# Patient Record
Sex: Male | Born: 1965
Health system: Southern US, Community
[De-identification: ages and names within clinical notes are randomized; demographics above are authoritative.]

## PROBLEM LIST (undated history)

## (undated) DIAGNOSIS — I10 Essential (primary) hypertension: Secondary | ICD-10-CM

## (undated) DIAGNOSIS — G709 Myoneural disorder, unspecified: Secondary | ICD-10-CM

## (undated) DIAGNOSIS — K219 Gastro-esophageal reflux disease without esophagitis: Secondary | ICD-10-CM

## (undated) DIAGNOSIS — E785 Hyperlipidemia, unspecified: Secondary | ICD-10-CM

## (undated) DIAGNOSIS — M47816 Spondylosis without myelopathy or radiculopathy, lumbar region: Secondary | ICD-10-CM

## (undated) DIAGNOSIS — F411 Generalized anxiety disorder: Secondary | ICD-10-CM

## (undated) DIAGNOSIS — J189 Pneumonia, unspecified organism: Secondary | ICD-10-CM

## (undated) DIAGNOSIS — Z720 Tobacco use: Secondary | ICD-10-CM

## (undated) HISTORY — DX: Essential (primary) hypertension: I10

## (undated) HISTORY — DX: Gastro-esophageal reflux disease without esophagitis: K21.9

## (undated) HISTORY — DX: Pneumonia, unspecified organism: J18.9

## (undated) HISTORY — DX: Generalized anxiety disorder: F41.1

## (undated) HISTORY — DX: Spondylosis without myelopathy or radiculopathy, lumbar region: M47.816

## (undated) HISTORY — DX: Myoneural disorder, unspecified: G70.9

## (undated) HISTORY — PX: BACK SURGERY: SHX140

## (undated) HISTORY — DX: Tobacco use: Z72.0

## (undated) HISTORY — PX: CARPAL TUNNEL RELEASE: SHX101

## (undated) HISTORY — PX: SPINE SURGERY: SHX786

## (undated) HISTORY — DX: Hyperlipidemia, unspecified: E78.5

---

## 2001-01-05 ENCOUNTER — Encounter: Payer: Self-pay | Admitting: Family Medicine

## 2001-01-05 ENCOUNTER — Ambulatory Visit (HOSPITAL_COMMUNITY): Admission: RE | Admit: 2001-01-05 | Discharge: 2001-01-05 | Payer: Self-pay | Admitting: Family Medicine

## 2001-03-18 ENCOUNTER — Inpatient Hospital Stay (HOSPITAL_COMMUNITY): Admission: RE | Admit: 2001-03-18 | Discharge: 2001-03-19 | Payer: Self-pay | Admitting: Neurosurgery

## 2001-03-18 ENCOUNTER — Encounter: Payer: Self-pay | Admitting: Neurosurgery

## 2001-04-01 HISTORY — PX: AMPUTATION: SHX166

## 2002-12-31 ENCOUNTER — Emergency Department (HOSPITAL_COMMUNITY): Admission: EM | Admit: 2002-12-31 | Discharge: 2002-12-31 | Payer: Self-pay | Admitting: Emergency Medicine

## 2002-12-31 ENCOUNTER — Encounter: Payer: Self-pay | Admitting: Emergency Medicine

## 2003-04-11 ENCOUNTER — Ambulatory Visit (HOSPITAL_COMMUNITY): Admission: RE | Admit: 2003-04-11 | Discharge: 2003-04-11 | Payer: Self-pay | Admitting: Family Medicine

## 2003-09-02 ENCOUNTER — Encounter (INDEPENDENT_AMBULATORY_CARE_PROVIDER_SITE_OTHER): Payer: Self-pay | Admitting: *Deleted

## 2003-09-02 ENCOUNTER — Ambulatory Visit (HOSPITAL_BASED_OUTPATIENT_CLINIC_OR_DEPARTMENT_OTHER): Admission: RE | Admit: 2003-09-02 | Discharge: 2003-09-02 | Payer: Self-pay | Admitting: Orthopedic Surgery

## 2004-03-30 ENCOUNTER — Ambulatory Visit (HOSPITAL_COMMUNITY): Admission: RE | Admit: 2004-03-30 | Discharge: 2004-03-30 | Payer: Self-pay | Admitting: Family Medicine

## 2004-05-25 ENCOUNTER — Ambulatory Visit (HOSPITAL_COMMUNITY): Admission: RE | Admit: 2004-05-25 | Discharge: 2004-05-25 | Payer: Self-pay | Admitting: Family Medicine

## 2005-06-05 ENCOUNTER — Ambulatory Visit (HOSPITAL_COMMUNITY): Admission: RE | Admit: 2005-06-05 | Discharge: 2005-06-05 | Payer: Self-pay | Admitting: Family Medicine

## 2006-06-24 ENCOUNTER — Ambulatory Visit (HOSPITAL_COMMUNITY): Admission: RE | Admit: 2006-06-24 | Discharge: 2006-06-24 | Payer: Self-pay | Admitting: Family Medicine

## 2008-09-09 ENCOUNTER — Ambulatory Visit (HOSPITAL_COMMUNITY): Admission: RE | Admit: 2008-09-09 | Discharge: 2008-09-09 | Payer: Self-pay | Admitting: Family Medicine

## 2008-09-19 ENCOUNTER — Emergency Department (HOSPITAL_COMMUNITY): Admission: EM | Admit: 2008-09-19 | Discharge: 2008-09-19 | Payer: Self-pay | Admitting: Emergency Medicine

## 2008-09-30 ENCOUNTER — Emergency Department (HOSPITAL_COMMUNITY): Admission: EM | Admit: 2008-09-30 | Discharge: 2008-09-30 | Payer: Self-pay | Admitting: Emergency Medicine

## 2008-10-06 ENCOUNTER — Ambulatory Visit (HOSPITAL_COMMUNITY): Admission: RE | Admit: 2008-10-06 | Discharge: 2008-10-07 | Payer: Self-pay | Admitting: Neurosurgery

## 2008-10-13 ENCOUNTER — Encounter (INDEPENDENT_AMBULATORY_CARE_PROVIDER_SITE_OTHER): Payer: Self-pay | Admitting: Neurosurgery

## 2008-10-13 ENCOUNTER — Ambulatory Visit: Payer: Self-pay | Admitting: Surgery

## 2008-10-13 ENCOUNTER — Ambulatory Visit: Admission: RE | Admit: 2008-10-13 | Discharge: 2008-10-13 | Payer: Self-pay | Admitting: Neurosurgery

## 2009-02-09 ENCOUNTER — Ambulatory Visit (HOSPITAL_COMMUNITY): Admission: RE | Admit: 2009-02-09 | Discharge: 2009-02-09 | Payer: Self-pay | Admitting: Family Medicine

## 2010-07-08 LAB — BASIC METABOLIC PANEL
BUN: 13 mg/dL (ref 6–23)
CO2: 29 mEq/L (ref 19–32)
Calcium: 9.6 mg/dL (ref 8.4–10.5)
Chloride: 99 mEq/L (ref 96–112)
Creatinine, Ser: 0.66 mg/dL (ref 0.4–1.5)
GFR calc Af Amer: >60 mL/min (ref >60)
GFR calc non Af Amer: >60 mL/min (ref >60)
Glucose, Bld: 113 mg/dL — ABNORMAL HIGH (ref 70–99)
Potassium: 4.5 mEq/L (ref 3.5–5.1)
Sodium: 139 mEq/L (ref 135–145)

## 2010-07-08 LAB — CBC
HCT: 40.7 % (ref 39.0–52.0)
Hemoglobin: 13.9 g/dL (ref 13.0–17.0)
MCHC: 34.1 g/dL (ref 30.0–36.0)
MCV: 90.2 fL (ref 78.0–100.0)
Platelets: 316 10*3/uL (ref 150–400)
RBC: 4.51 MIL/uL (ref 4.22–5.81)
RDW: 13.6 % (ref 11.5–15.5)
WBC: 11 10*3/uL — ABNORMAL HIGH (ref 4.0–10.5)

## 2010-08-14 NOTE — Op Note (Signed)
Terrence Robinson, Terrence Robinson               ACCOUNT NO.:  [id number]   MEDICAL RECORD NO.:  [id number]          PATIENT TYPE:  OIB   LOCATION:  3528                         FACILITY:  [facility]   PHYSICIAN:  Hewitt Shorts, M.D.DATE OF BIRTH:  [birth year]   DATE OF PROCEDURE:  10/06/2008  DATE OF DISCHARGE:                               OPERATIVE REPORT   PREOPERATIVE DIAGNOSES:  1. Left L4-5 lumbar disk herniation.  2. Lumbar stenosis.  3. Lumbar spondylosis.  4. Lumbar radiculopathy.   POSTOPERATIVE DIAGNOSES:  1. Left L4-5 lumbar disk herniation.  2. Lumbar stenosis.  3. Lumbar spondylosis.  4. Lumbar radiculopathy.   PROCEDURES:  Left L4-5 lumbar laminotomy and microdiskectomy with  microdissection.   SURGEON:  Hewitt Shorts, MD.   ASSISTANT:  Clydene Fake, MD.   ANESTHESIA:  General endotracheal.   INDICATIONS:  The patient is a 45 year old man who presented with left  lumbar radiculopathy.  MRI scan showed a fairly large sensitive left L4-  5 lumbar disk herniation.  He also had hypertrophic changes involving  the left L4-5 facet with bony and ligament overgrowth causing dorsal  lateral encroachment with a resulting marked lateral recess stenosis.  Decision was made to proceed with decompression.   PROCEDURE:  The patient was brought to the operating room and placed  under general endotracheal anesthesia.  The patient was turned to a  prone position.  Lumbar region was prepped with Betadine soap and  solution and draped in a sterile fashion.  The midline was infiltrated  with local anesthetic with epinephrine and x-ray was taken.  The L4-L5  level was identified and a incision was made over the L4-5 level and  carried down through the subcutaneous tissue.  Bipolar electrocautery  was used to maintain hemostasis.  Dissection was carried down to the  lumbar fascia, which was incised on the left side of the midline.  The  paraspinal muscles were dissected from the  spinous process and lamina in  a subperiosteal fashion.  The L4-L5 intralaminar space was identified.  Then, additional x-rays were taken reconfirmed the localization.  We  then draped the operating microscope and the remainder of the procedure  was performed using microdissection and microsurgical technique.  The  laminotomy was performed using the [employer] high-speed drill along with  Kerrison punches.  The ligamentum bony overgrowth were removed  decompressing the lateral aspect of the thecal sac and exiting left L5  nerve root.  We then gently retracted the thecal sac and nerve root  medially and large disk herniation exposed overlying epidural veins were  coagulated and divided.  We then incised the remaining portion of the  annulus, although there was fragment protruding through the annulus and  was decided to open it up further to more effectively remove the disk  herniation, and the disk herniation was removed in the piecemeal  fashion.  We then entered in to the disk space and continued to remove  degenerated disk material.  A thorough diskectomy was performed using a  variety of pituitary rongeurs with good decompression of the thecal sac  and nerve root and removal of all loose fragments of the disc material  from both the disc space and the epidural space.  Once the diskectomy  was completed, hemostasis was established with the use of bipolar  cautery, Gelfoam and thrombin and the edges of the bone were waxed as  needed.  Gelfoam was removed.  The wound was irrigated.  Hemostasis was  confirmed and then we proceeded with closure.  Prior to closure, we  instilled 2 mL of fentanyl and 80 mg of Depo-Medrol into the epidural  space.  The deep fascia was closed with interrupted undyed #1 Vicryl  sutures.  Scarpa fascia was closed with interrupted undyed #1 Vicryl  sutures.  The subcutaneous and subcuticular were closed with interrupted  inverted 2-0 undyed Vicryl sutures. The skin  was reapproximated with  Dermabond.  The procedure was tolerated well.  The estimated blood loss  was 50 mL.  Sponge and needle count were correct.  Following surgery,  the patient was returned back to the supine position, to be reversed  from the anesthetic, extubated, and transferred to the recovery room for  further care.      Hewitt Shorts, M.D.  Electronically Signed     RWN/MEDQ  D:  10/06/2008  T:  10/07/2008  Job:  [id number]

## 2010-08-17 NOTE — Op Note (Signed)
NAME:  Terrence Robinson, Terrence Robinson                         ACCOUNT NO.:  [id number]   MEDICAL RECORD NO.:  [id number]                   PATIENT TYPE:  AMB   LOCATION:  DSC                                  FACILITY:  [facility]   PHYSICIAN:  Katy Fitch. Naaman Plummer., M.D.          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   POSTOPERATIVE DIAGNOSIS:  Painful amputation stump, left index finger,  status post crushing injury October 2004, with clinical evidence of ulnar  proper digital nerve neuroma and probable radial proper digital nerve  neuroma and retained nail remnant.   OPERATION:  Distal interphalangeal disarticulation and revision amputation  of left index finger amputation stump with resection of nail remnants and  resection of ulnar proper digital nerve neuroma and radial proper digital  nerve neuroma.   OPERATING SURGEON:  Katy Fitch. Sypher, M.D.   ANESTHESIA:  General by LMA, supervising anesthesiologist is Janetta Hora.  Gelene Mink, M.D.   INDICATIONS:  Tarquin Welcher is Robinson 45 year old gentleman employed in  Set [employer].  In October 2004 he had Robinson crushing injury to his left index  finger.  He had Robinson revision amputation performed at Robinson proximal epiphyseal  level of the distal phalanx.   During his recovery from the injury he noted the development of Robinson nail horn  and Robinson painful mass on the ulnar aspect of his fingertip.   The mass was consistent with either an epidermal inclusion cyst or Robinson painful  neuroma.   He observed this for approximately months and presented for Robinson hand surgery  consultation.  He was Robinson former patient of the [employer] [facility] [place] and  was familiar with our practice.   Clinical examination suggested an ulnar proper digital nerve neuroma and Robinson  retained nail fragment.   X-rays revealed that his distal phalangeal fragment was less than 2 mm in  height and would not be functional.   We recommended revision amputation with DIP disarticulation and excision of  his neuromas and nail remnants.   After informed consent, he is brought to the operating room at this time.   Karmelo Alfonzo was brought to the operating room and placed in the supine  position upon the operating table.  Following the induction of general  anesthesia by LMA, the left arm was prepped with Betadine soap and solution  and sterilely draped.  Robinson pneumatic tourniquet was applied to the proximal  brachium.   Following exsanguination of the arm with an Esmarch bandage, the arterial  tourniquet was inflated to 230 mmHg.  The procedure commenced with an  ellipsoid skin incision incorporating the nail  remnant.  This was extended  along the radial and ulnar midlateral lines to the neck of the middle  phalanx.   Subcutaneous tissues were meticulously dissected with Robinson 64 Beaver blade,  removing the dorsal nail remnants.  The distal phalanx was skeletonized with  removal of all soft tissues deep to the wedge skin resection.  The radial  and ulnar proper digital nerve neuromas were dissected free with fine  scissors, and there was noted to be an approximately 6 mm neuroma involving  the ulnar proper digital nerve and Robinson 4 mm neuroma involving the radial  proper digital nerve.  These were adherent to the volar plate and skin.   The neuromas were dissected free and the radial and ulnar proper digital  nerves were placed under tension and transected with Robinson fresh 15 blade.  The  proximal neuroma stumps were allowed to retract into virgin fat.   The collateral ligaments of the DIP joint were resected, followed by removal  of the remaining fragment of the distal  phalanx.  The distal condyles of the  middle phalanx were reshaped into Robinson bullet shape by removal of the palmar  radial and ulnar condyles with Robinson rongeur.   The soft tissues were tailored to provide Robinson satisfactory appearance of the  fingertip, followed by closure of the wound with mattress suture of 5-0  nylon.   There were no apparent complications.   Mr. Lips tolerated his surgery and anesthesia well.  He was transferred  to the recovery room with stable vital signs.   For aftercare he is given prescriptions for Percocet 5 mg one or two tablets  p.o. q.4-6h. p.r.n. pain, Robinson total of 20 tablets without refill.  He is also  given Robinson prescription for Keflex one p.o. q.8h. x4 days as Robinson prophylactic  antibiotic.                                               Katy Fitch Naaman Plummer., M.D.    RVS/MEDQ  D:  09/02/2003  T:  09/02/2003  Job:  [id number]

## 2010-08-17 NOTE — Consult Note (Signed)
   NAME:  Terrence, BIEHN Robinson                         ACCOUNT NO.:  [id number]   MEDICAL RECORD NO.:  [id number]                   PATIENT TYPE:  EMS   LOCATION:  ED                                   FACILITY:  [facility]   PHYSICIAN:  Nadara Mustard, M.D.                DATE OF BIRTH:  [birth year]   DATE OF CONSULTATION:  12/31/2002  DATE OF DISCHARGE:                                   CONSULTATION   HISTORY OF PRESENT ILLNESS:  The patient is Robinson 45 year old gentleman who was  at work. 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 Blood pressure 153/92, pulse 94,  respiratory rate 20, O2 saturations 99% on room air.   PAST MEDICAL HISTORY:  Negative.   REVIEW OF SYMPTOMS:  Positive for anxiety.   IMMUNIZATIONS:  Tetanus unknown.   SOCIAL HISTORY:  He works as Robinson Location manager.   Checked on examination, the tuft of the left index finger is amputated. He  is left hand dominant. The complete nail matrix has been amputated as well.  Radiographs shows partial amputation of the distal tuft of the left index  finger.   ASSESSMENT:  Open amputation left index finger.   PLAN:  The patient underwent Robinson digital block with 10 mL of 1% lidocaine  plain. After an adequate level of anesthesia was obtained, the patient's  left upper extremity was prepped using Betadine paint and scrub and draped  into Robinson sterile field. The edge of the skin were trimmed. An advancement flap  was created both dorsally and palmar ly. The distal tuft of the index finger  was amputated with the rongeur. The wound was irrigated, cleansed,  hemostasis was obtained. The patient received Robinson DT shot and IV Kefzol prior  to surgery. After cleansing, the advancement flaps were advanced and the  wound was closed using 3-0 nylon. The wound was covered with Adaptic  orthopedic sponges, sterile dressing and Robinson  Coban dressing. The patient was  discharged to home. He was given Robinson prescription for Keflex and Vicodin. Plan  to followup in the office in three days.                                               Nadara Mustard, M.D.    MVD/MEDQ  D:  12/31/2002  T:  01/01/2003  Job:  [id number]

## 2010-08-17 NOTE — Op Note (Signed)
[facility]  Patient:    Terrence Robinson, Terrence Robinson Visit Number: [id number] MRN: [id number]          Service Type: SUR Location: [id number] Attending Physician:  Barton Fanny Dictated by:   Hewitt Shorts, M.D. Proc. Date: 03/18/01 Admit Date:  03/18/2001 Discharge Date: 03/19/2001                             Operative Report  PREOPERATIVE DIAGNOSIS:  Right L5-S1 lumbar disk herniation.  POSTOPERATIVE DIAGNOSIS:  Right L5-S1 lumbar disk herniation.  PROCEDURE:  Right L5-S1 lumbar laminotomy and microdiskectomy.  SURGEON:  Hewitt Shorts, M.D.  ASSISTANT:  Stefani Dama, M.D.  ANESTHESIA:  General endotracheal.  INDICATIONS:  The patient is a 45 year old man who presents with right lumbar radiculopathy.  The decision was made to proceed with elective laminotomy and microdiskectomy.  DESCRIPTION OF PROCEDURE:  The patient was brought to the operating room and placed under general endotracheal anesthesia.  The patient was turned to a prone position.  The lumbar region was prepped with Betadine soap and solution, and draped in a sterile fashion.  The midline was infiltrated with local anesthetic with epinephrine.  A localizing x-ray was taken and the L5-S1 level identified and a midline incision made over the L5-S1 level and carried down to the subcutaneous tissue.  Dissection was carried down to the lumbar fascia which was incised on the right side of the midline, and the paraspinal muscle with resection of the spinous process and lamina in a subperiosteal fashion.  A self-retaining retractor was placed and the L5-S1 interlaminar space identified.  An x-ray was taken to confirm the localization, and then a laminotomy was performed using the [facility] Max drill and Kerrison punches.  The microscope was draped and brought into the field to provide instant magnification, illumination, and visualization, and the remainder of the  procedure was performed using microdissection and microsurgical technique.  The ligamentum flavum was carefully protected, and the underlying thecal sac and nerve root were identified.  There was a large disk fragment protruding into the axilla of the S1 nerve root.  The overlying epidural veins were coagulated and incised, and the disk herniation incised, and disk material removed.  We eventually removed a sufficient amount of disk material so as to be able to retract the S1 nerve root along with the thecal sac.  There was significant _________ overgrowth of both bone and ligament tissue and this was removed as well.  In the end, all loose fragments of disk material were removed from both the epidural space and the disk space, and in the end, good decompression of the thecal sac and nerve root was achieved. The wound was irrigated with Bacitracin solution.  Check for hemostasis was established and confirmed, and then we instilled 2 cc of fentanyl and 80 mg of Depo-Medrol into the epidural space and proceeded with closure.  The deep fascia was closed with interrupted undyed 0 Vicryl sutures, and the subcutaneous and subcuticular were closed with interrupted and inverted 2-0 undyed Vicryl sutures, and the skin was reapproximated with Dermabond.  The patient tolerated the procedure well.  Estimated blood loss was between 25 cc and 50 cc.  Sponge and needle count correct.  Following surgery, the patient was turned back to the supine position to be reversed from the anesthetic, extubated, and transferred to the recovery room for further care. Dictated by:  Hewitt Shorts, M.D. Attending Physician:  Barton Fanny DD:  03/18/01 TD:  03/19/01 Job: [id number]

## 2010-10-16 ENCOUNTER — Other Ambulatory Visit (HOSPITAL_COMMUNITY): Payer: Self-pay | Admitting: Family Medicine

## 2010-10-16 DIAGNOSIS — M549 Dorsalgia, unspecified: Secondary | ICD-10-CM

## 2010-10-19 ENCOUNTER — Ambulatory Visit (HOSPITAL_COMMUNITY)
Admission: RE | Admit: 2010-10-19 | Discharge: 2010-10-19 | Disposition: A | Discharge status: Home / Self Care | Payer: BC Managed Care – PPO | Source: Ambulatory Visit | Attending: Family Medicine | Admitting: Family Medicine

## 2010-10-19 DIAGNOSIS — Z9889 Other specified postprocedural states: Secondary | ICD-10-CM | POA: Insufficient documentation

## 2010-10-19 DIAGNOSIS — M549 Dorsalgia, unspecified: Secondary | ICD-10-CM

## 2010-10-19 DIAGNOSIS — M5126 Other intervertebral disc displacement, lumbar region: Secondary | ICD-10-CM | POA: Insufficient documentation

## 2010-10-19 DIAGNOSIS — R209 Unspecified disturbances of skin sensation: Secondary | ICD-10-CM | POA: Insufficient documentation

## 2010-10-19 DIAGNOSIS — M545 Low back pain, unspecified: Secondary | ICD-10-CM | POA: Insufficient documentation

## 2010-10-25 ENCOUNTER — Encounter (HOSPITAL_COMMUNITY)
Admission: RE | Admit: 2010-10-25 | Discharge: 2010-10-25 | Disposition: A | Discharge status: Home / Self Care | Payer: BC Managed Care – PPO | Source: Ambulatory Visit | Attending: Neurosurgery | Admitting: Neurosurgery

## 2010-10-25 ENCOUNTER — Ambulatory Visit (HOSPITAL_COMMUNITY)
Admission: RE | Admit: 2010-10-25 | Discharge: 2010-10-25 | Disposition: A | Discharge status: Home / Self Care | Payer: BC Managed Care – PPO | Source: Ambulatory Visit | Attending: Neurosurgery | Admitting: Neurosurgery

## 2010-10-25 ENCOUNTER — Other Ambulatory Visit (HOSPITAL_COMMUNITY): Payer: Self-pay | Admitting: Neurosurgery

## 2010-10-25 DIAGNOSIS — F17200 Nicotine dependence, unspecified, uncomplicated: Secondary | ICD-10-CM | POA: Insufficient documentation

## 2010-10-25 DIAGNOSIS — M5126 Other intervertebral disc displacement, lumbar region: Secondary | ICD-10-CM

## 2010-10-25 DIAGNOSIS — I10 Essential (primary) hypertension: Secondary | ICD-10-CM | POA: Insufficient documentation

## 2010-10-25 LAB — BASIC METABOLIC PANEL
BUN: 15 mg/dL (ref 6–23)
CO2: 27 mEq/L (ref 19–32)
Calcium: 10.2 mg/dL (ref 8.4–10.5)
Chloride: 97 mEq/L (ref 96–112)
Creatinine, Ser: 0.8 mg/dL (ref 0.50–1.35)
GFR calc Af Amer: >60 mL/min (ref >60)
GFR calc non Af Amer: >60 mL/min (ref >60)
Glucose, Bld: 93 mg/dL (ref 70–99)
Potassium: 3.9 mEq/L (ref 3.5–5.1)
Sodium: 136 mEq/L (ref 135–145)

## 2010-10-25 LAB — CBC
HCT: 41.9 % (ref 39.0–52.0)
Hemoglobin: 14.9 g/dL (ref 13.0–17.0)
MCH: 31.2 pg (ref 26.0–34.0)
MCHC: 35.6 g/dL (ref 30.0–36.0)
MCV: 87.7 fL (ref 78.0–100.0)
Platelets: 346 10*3/uL (ref 150–400)
RBC: 4.78 MIL/uL (ref 4.22–5.81)
RDW: 14 % (ref 11.5–15.5)
WBC: 13.3 10*3/uL — ABNORMAL HIGH (ref 4.0–10.5)

## 2010-10-25 LAB — SURGICAL PCR SCREEN
MRSA, PCR: NEGATIVE
Staphylococcus aureus: NEGATIVE

## 2010-10-26 ENCOUNTER — Observation Stay (HOSPITAL_COMMUNITY)
Admission: RE | Admit: 2010-10-26 | Discharge: 2010-10-27 | Disposition: A | Discharge status: Home / Self Care | Payer: BC Managed Care – PPO | Source: Ambulatory Visit | Attending: Neurosurgery | Admitting: Neurosurgery

## 2010-10-26 ENCOUNTER — Observation Stay (HOSPITAL_COMMUNITY): Payer: BC Managed Care – PPO

## 2010-10-26 DIAGNOSIS — F17200 Nicotine dependence, unspecified, uncomplicated: Secondary | ICD-10-CM | POA: Insufficient documentation

## 2010-10-26 DIAGNOSIS — Z01810 Encounter for preprocedural cardiovascular examination: Secondary | ICD-10-CM | POA: Insufficient documentation

## 2010-10-26 DIAGNOSIS — Z01812 Encounter for preprocedural laboratory examination: Secondary | ICD-10-CM | POA: Insufficient documentation

## 2010-10-26 DIAGNOSIS — M5126 Other intervertebral disc displacement, lumbar region: Principal | ICD-10-CM | POA: Insufficient documentation

## 2010-10-26 DIAGNOSIS — I10 Essential (primary) hypertension: Secondary | ICD-10-CM | POA: Insufficient documentation

## 2010-10-26 DIAGNOSIS — M47817 Spondylosis without myelopathy or radiculopathy, lumbosacral region: Secondary | ICD-10-CM | POA: Insufficient documentation

## 2010-10-26 DIAGNOSIS — E669 Obesity, unspecified: Secondary | ICD-10-CM | POA: Insufficient documentation

## 2010-10-26 DIAGNOSIS — Z79899 Other long term (current) drug therapy: Secondary | ICD-10-CM | POA: Insufficient documentation

## 2010-11-05 NOTE — Op Note (Signed)
NAMEZAYON, TRULSON               ACCOUNT NO.:  [id number]  MEDICAL RECORD NO.:  [id number]  LOCATION:  3599                         FACILITY:  [facility]  PHYSICIAN:  Hewitt Shorts, M.D.DATE OF BIRTH:  Mar 01, 1966  DATE OF PROCEDURE:  10/26/2010 DATE OF DISCHARGE:                              OPERATIVE REPORT   PREOPERATIVE DIAGNOSIS:  Left L2-3 lumbar degenerative disease, lumbar spondylosis, and lumbar radiculopathy.  POSTOPERATIVE DIAGNOSIS:  Left L2-3 lumbar degenerative disease, lumbar spondylosis, and lumbar radiculopathy.  PROCEDURE:  Left L2-3 lumbar laminotomy and microdiskectomy.  SURGEON:  Hewitt Shorts, MD  ANESTHESIA:  General endotracheal.  INDICATIONS:  The patient is a 45 year old man who presented with an acute left lumbar radiculopathy.  MRI scan revealed a L2-3 disk herniation with fragment migrated caudally behind the body of L3. Decision made to proceed with elective laminotomy and microdiskectomy.  PROCEDURE:  The patient was brought to the operating room and placed on general endotracheal anesthesia.  The patient was turned to prone position, lumbar region was prepped with Betadine soap solution and draped in sterile fashion.  The midline cyst with local epinephrine and then an x-ray was taken.  The L2-3 level identified and a midline incision made over the L2-3 level and carried down through the subcutaneous tissue.  Bipolar cautery and electrocautery used to maintain hemostasis.  Dissection was carried down to the lumbar fascia which was incised on the left side of the midline.  The paraspinal muscles were dissected with spinous process and lamina in subperiosteal fashion.  Another x-ray was taken.  The L2-3 interlaminar space was identified and then the operating microscope was draped and brought to the field for additional navigation, illumination and visualization. The remainder of decompression performed using microdissection  with microsurgical technique.  A laminotomy was performed using the high- speed drill and Kerrison punches.  Ligamentum flavum was carefully removed and we identified the thecal sac and exiting left L3 nerve root. We gently retracted the nerve root medially and immediately encountered a free fragment disk herniation.  This was removed.  We then further examined the epidural space.  The L2-3 disk was degenerated.  There was a small rent in the annulus of the disk through which the fragment had herniated but there was no further fragments within the epidural space and good decompression of the L3 nerve root and thecal sac had been achieved.  It was felt that there had been no advantage entering the disk space and therefore the wound was irrigated with bacitracin solution.  Hemostasis was established and then we instilled 2 mL of fentanyl and 80 mg Depo- Medrol into the epidural space to proceed with closure.  Deep fascia was closed with undyed 1 Vicryl sutures.  Scarpa fascia was closed with undyed 1 and 2-0 undyed Vicryl sutures.  The subcutaneous and subcuticular were closed with inverted 2-0 and 3-0 Vicryl sutures.  Skin was closed with Dermabond.  The procedure was tolerated well.  Estimated blood loss was 25 mL.  Sponge and needle count were correct.  Following surgery, the patient was to be turned back to supine position, reversed the anesthetic, extubated, transferred to the recovery room for further care.  Hewitt Shorts, M.D.     RWN/MEDQ  D:  10/26/2010  T:  10/26/2010  Job:  [id number]  Electronically Signed by Shirlean Kelly M.D. on 11/05/2010 10:55:51 AM

## 2010-12-20 NOTE — Discharge Summary (Signed)
  NAMELAKE, CINQUEMANI               ACCOUNT NO.:  [id number]  MEDICAL RECORD NO.:  [id number]  LOCATION:  3534                         FACILITY:  [facility]  PHYSICIAN:  Hewitt Shorts, M.D.DATE OF BIRTH:  03-Oct-1965  DATE OF ADMISSION:  10/26/2010 DATE OF DISCHARGE:  10/27/2010                              DISCHARGE SUMMARY   ADMISSION HISTORY AND PHYSICAL EXAMINATION:  The patient is a 45 year old man who presented with acute left lumbar radiculopathy, was found to be due to a left L2-3 lumbar disk herniation.  His symptoms had begun about 2-1/2 weeks earlier and he had no relief with medications and rest and he was evaluated with MRI scan which showed lumbar disk herniation and was admitted for surgical decompression.  Past history was notable for hypertension.  He does smoke tobacco and drink alcoholic beverages. General examination was intact.  Neurologic examination showed intact strength and sensation.  HOSPITAL COURSE:  The patient was admitted, underwent a left L2-3 lumbar laminotomy and microdiskectomy.  He did well following surgery.  He was up and ambulating and he was discharged by Dr. Coletta Memos who did not write a discharge note but who presumably saw the patient on the day of discharge.  I had given the patient a prescription for Percocet 1-2 tablets q.4-6 h p.r.n. pain 50 tablets, no refills, to use during the postoperative period and he was given instructions by me and possibly by Dr. Franky Macho as well regarding activities and wound care following discharge.  DISCHARGE DIAGNOSIS:  Lumbar disk herniation.  DISCHARGE CONDITION:  Good.     Hewitt Shorts, M.D.     RWN/MEDQ  D:  12/14/2010  T:  12/15/2010  Job:  [id number]  Electronically Signed by Shirlean Kelly M.D. on 12/20/2010 10:06:01 AM

## 2016-04-03 ENCOUNTER — Encounter: Payer: Self-pay | Admitting: Family Medicine

## 2016-04-05 ENCOUNTER — Telehealth: Payer: Self-pay

## 2016-04-05 ENCOUNTER — Ambulatory Visit (INDEPENDENT_AMBULATORY_CARE_PROVIDER_SITE_OTHER): Payer: BLUE CROSS/BLUE SHIELD | Admitting: Family Medicine

## 2016-04-05 ENCOUNTER — Encounter: Payer: Self-pay | Admitting: Family Medicine

## 2016-04-05 VITALS — BP 150/78 | HR 86 | Temp 98.4°F | Resp 18 | Ht 71.5 in | Wt 248.1 lb

## 2016-04-05 DIAGNOSIS — M47816 Spondylosis without myelopathy or radiculopathy, lumbar region: Secondary | ICD-10-CM | POA: Insufficient documentation

## 2016-04-05 DIAGNOSIS — Z7689 Persons encountering health services in other specified circumstances: Secondary | ICD-10-CM

## 2016-04-05 DIAGNOSIS — Z720 Tobacco use: Secondary | ICD-10-CM

## 2016-04-05 DIAGNOSIS — G8929 Other chronic pain: Secondary | ICD-10-CM | POA: Diagnosis not present

## 2016-04-05 DIAGNOSIS — F411 Generalized anxiety disorder: Secondary | ICD-10-CM | POA: Diagnosis not present

## 2016-04-05 DIAGNOSIS — M5441 Lumbago with sciatica, right side: Secondary | ICD-10-CM

## 2016-04-05 DIAGNOSIS — M4726 Other spondylosis with radiculopathy, lumbar region: Secondary | ICD-10-CM

## 2016-04-05 DIAGNOSIS — I10 Essential (primary) hypertension: Secondary | ICD-10-CM | POA: Diagnosis not present

## 2016-04-05 DIAGNOSIS — Z23 Encounter for immunization: Secondary | ICD-10-CM

## 2016-04-05 DIAGNOSIS — Z8042 Family history of malignant neoplasm of prostate: Secondary | ICD-10-CM | POA: Diagnosis not present

## 2016-04-05 DIAGNOSIS — Z716 Tobacco abuse counseling: Secondary | ICD-10-CM

## 2016-04-05 DIAGNOSIS — K219 Gastro-esophageal reflux disease without esophagitis: Secondary | ICD-10-CM

## 2016-04-05 HISTORY — DX: Tobacco use: Z72.0

## 2016-04-05 LAB — CBC
HCT: 42.3 % (ref 38.5–50.0)
Hemoglobin: 14.4 g/dL (ref 13.2–17.1)
MCH: 30.8 pg (ref 27.0–33.0)
MCHC: 34 g/dL (ref 32.0–36.0)
MCV: 90.4 fL (ref 80.0–100.0)
MPV: 9 fL (ref 7.5–12.5)
PLATELETS: 324 10*3/uL (ref 140–400)
RBC: 4.68 MIL/uL (ref 4.20–5.80)
RDW: 13.7 % (ref 11.0–15.0)
WBC: 6.2 10*3/uL (ref 3.8–10.8)

## 2016-04-05 LAB — LIPID PANEL
CHOL/HDL RATIO: 4.1 ratio (ref <5.0)
Cholesterol: 181 mg/dL (ref <200)
HDL: 44 mg/dL (ref >40)
LDL Cholesterol: 120 mg/dL — ABNORMAL HIGH (ref <100)
Triglycerides: 85 mg/dL (ref <150)
VLDL: 17 mg/dL (ref <30)

## 2016-04-05 LAB — URINALYSIS, MICROSCOPIC ONLY
BACTERIA UA: NONE SEEN [HPF]
Casts: NONE SEEN [LPF]
Crystals: NONE SEEN [HPF]
Squamous Epithelial / LPF: NONE SEEN [HPF] (ref <=5)
YEAST: NONE SEEN [HPF]

## 2016-04-05 LAB — COMPLETE METABOLIC PANEL WITH GFR
ALT: 20 U/L (ref 9–46)
AST: 17 U/L (ref 10–40)
Albumin: 4.5 g/dL (ref 3.6–5.1)
Alkaline Phosphatase: 39 U/L — ABNORMAL LOW (ref 40–115)
BILIRUBIN TOTAL: 0.3 mg/dL (ref 0.2–1.2)
BUN: 15 mg/dL (ref 7–25)
CO2: 27 mmol/L (ref 20–31)
CREATININE: 0.63 mg/dL (ref 0.60–1.35)
Calcium: 9.4 mg/dL (ref 8.6–10.3)
Chloride: 104 mmol/L (ref 98–110)
GFR, Est African American: >89 mL/min (ref >=60)
GLUCOSE: 90 mg/dL (ref 65–99)
Potassium: 4.5 mmol/L (ref 3.5–5.3)
SODIUM: 138 mmol/L (ref 135–146)
TOTAL PROTEIN: 6.8 g/dL (ref 6.1–8.1)

## 2016-04-05 LAB — URINALYSIS, ROUTINE W REFLEX MICROSCOPIC
Bilirubin Urine: NEGATIVE
Glucose, UA: NEGATIVE
HGB URINE DIPSTICK: NEGATIVE
KETONES UR: NEGATIVE
NITRITE: NEGATIVE
PROTEIN: NEGATIVE
SPECIFIC GRAVITY, URINE: 1.015 (ref 1.001–1.035)
pH: 6 (ref 5.0–8.0)

## 2016-04-05 LAB — PSA: PSA: 1 ng/mL (ref <=4.0)

## 2016-04-05 MED ORDER — NAPROXEN 500 MG PO TABS: 500.0000 mg | ORAL_TABLET | Freq: Two times a day (BID) | ORAL | 0 refills | Status: DC

## 2016-04-05 MED ORDER — METHYLPREDNISOLONE 4 MG PO TBPK: ORAL_TABLET | ORAL | 0 refills | Status: DC

## 2016-04-05 MED ORDER — GABAPENTIN 300 MG PO CAPS: 300.0000 mg | ORAL_CAPSULE | Freq: Every day | ORAL | 0 refills | Status: DC

## 2016-04-05 NOTE — Patient Instructions (Addendum)
Exercise every day to your tolerance                                NEED OLD RECORDS  For your back: Take the medrol pak ( prednisone) as directed Start the naprosyn after prednisone finished Take the gabapentin at bedtime This is for nerve pain and sleep Call if not able to return to work by Monday  For your health: Try to quit smoking Come back for a physical I have ordered lab testing Eat a healthy diet   DASH Eating Plan DASH stands for "Dietary Approaches to Stop Hypertension." The DASH eating plan is a healthy eating plan that has been shown to reduce high blood pressure (hypertension). Additional health benefits may include reducing the risk of type 2 diabetes mellitus, heart disease, and stroke. The DASH eating plan may also help with weight loss. What do I need to know about the DASH eating plan? For the DASH eating plan, you will follow these general guidelines:  Choose foods with less than 150 milligrams of sodium per serving (as listed on the food label).  Use salt-free seasonings or herbs instead of table salt or sea salt.  Check with your health care provider or pharmacist before using salt substitutes.  Eat lower-sodium products. These are often labeled as "low-sodium" or "no salt added."  Eat fresh foods. Avoid eating a lot of canned foods.  Eat more vegetables, fruits, and low-fat dairy products.  Choose whole grains. Look for the word "whole" as the first word in the ingredient list.  Choose fish and skinless chicken or [place] more often than red meat. Limit fish, poultry, and meat to 6 oz (170 g) each day.  Limit sweets, desserts, sugars, and sugary drinks.  Choose heart-healthy fats.  Eat more home-cooked food and less restaurant, buffet, and fast food.  Limit fried foods.  Do not fry foods. Cook foods using methods such as baking, boiling, grilling, and broiling instead.  When eating at a restaurant, ask that your food be prepared with less salt, or  no salt if possible. What foods can I eat? Seek help from a dietitian for individual calorie needs. Grains  Whole grain or whole wheat bread. Brown rice. Whole grain or whole wheat pasta. Quinoa, bulgur, and whole grain cereals. Low-sodium cereals. Corn or whole wheat flour tortillas. Whole grain cornbread. Whole grain crackers. Low-sodium crackers. Vegetables  Fresh or frozen vegetables (raw, steamed, roasted, or grilled). Low-sodium or reduced-sodium tomato and vegetable juices. Low-sodium or reduced-sodium tomato sauce and paste. Low-sodium or reduced-sodium canned vegetables. Fruits  All fresh, canned (in natural juice), or frozen fruits. Meat and Other Protein Products  Ground beef (85% or leaner), grass-fed beef, or beef trimmed of fat. Skinless chicken or [place]. Ground chicken or [place]. Pork trimmed of fat. All fish and seafood. Eggs. Dried beans, peas, or lentils. Unsalted nuts and seeds. Unsalted canned beans. Dairy  Low-fat dairy products, such as skim or 1% milk, 2% or reduced-fat cheeses, low-fat ricotta or cottage cheese, or plain low-fat yogurt. Low-sodium or reduced-sodium cheeses. Fats and Oils  Tub margarines without trans fats. Light or reduced-fat mayonnaise and salad dressings (reduced sodium). Avocado. Safflower, olive, or canola oils. Natural peanut or almond butter. Other  Unsalted popcorn and pretzels. The items listed above may not be a complete list of recommended foods or beverages. Contact your dietitian for more options.  What foods are not recommended? Grains  White bread. White pasta. White rice. Refined cornbread. Bagels and croissants. Crackers that contain trans fat. Vegetables  Creamed or fried vegetables. Vegetables in a cheese sauce. Regular canned vegetables. Regular canned tomato sauce and paste. Regular tomato and vegetable juices. Fruits  Canned fruit in light or heavy syrup. Fruit juice. Meat and Other Protein Products  Fatty cuts of meat. Ribs,  chicken wings, bacon, sausage, bologna, salami, chitterlings, fatback, hot dogs, bratwurst, and packaged luncheon meats. Salted nuts and seeds. Canned beans with salt. Dairy  Whole or 2% milk, cream, half-and-half, and cream cheese. Whole-fat or sweetened yogurt. Full-fat cheeses or blue cheese. Nondairy creamers and whipped toppings. Processed cheese, cheese spreads, or cheese curds. Condiments  Onion and garlic salt, seasoned salt, table salt, and sea salt. Canned and packaged gravies. Worcestershire sauce. Tartar sauce. Barbecue sauce. Teriyaki sauce. Soy sauce, including reduced sodium. Steak sauce. Fish sauce. Oyster sauce. Cocktail sauce. Horseradish. Ketchup and mustard. Meat flavorings and tenderizers. Bouillon cubes. Hot sauce. Tabasco sauce. Marinades. Taco seasonings. Relishes. Fats and Oils  Butter, stick margarine, lard, shortening, ghee, and bacon fat. Coconut, palm kernel, or palm oils. Regular salad dressings. Other  Pickles and olives. Salted popcorn and pretzels. The items listed above may not be a complete list of foods and beverages to avoid. Contact your dietitian for more information.  Where can I find more information? National Heart, Lung, and Blood Institute: CablePromo.itwww.nhlbi.nih.gov/health/health-topics/topics/dash/ This information is not intended to replace advice given to you by your health care provider. Make sure you discuss any questions you have with your health care provider. Document Released: 03/07/2011 Document Revised: 08/24/2015 Document Reviewed: 01/20/2013 Elsevier Interactive Patient Education  2017 [employer]

## 2016-04-05 NOTE — Telephone Encounter (Signed)
Terrence Robinson. Please abstract.

## 2016-04-05 NOTE — Progress Notes (Signed)
Chief Complaint  Patient presents with  . Establish Care   New to establish No old records available History hypertension, he states well controlled.  Not so today.  Will get old records and follow.  denies heart disease or hypertension complications. I have discussed the multiple health risks associated with cigarette smoking including, but not limited to, cardiovascular disease, lung disease and cancer.  I have strongly recommended that smoking be stopped.  I have reviewed the various methods of quitting including cold [place]key, classes, nicotine replacements and prescription medications.  I have offered assistance in this difficult process.  The patient is not interested in assistance at this time. Biggest ongoing problem is low back pain.  Has had 3 spinal surgeries. 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 Now has a flare of his back pain since Dec 24. He has chronic low back pain with left sciatica, this flare includes right leg pain from buttock to foot. No specific incident.  No trauma or fall.  Has taken tylenol for pain.   Flu shot this year. tetanus 2003 Pneumonia shot 2010   Patient Active Problem List   Diagnosis Date Noted  . Essential hypertension 04/05/2016  . Family history of prostate cancer 04/05/2016  . Generalized anxiety disorder 04/05/2016  . GERD (gastroesophageal reflux disease) 04/05/2016  . Degenerative joint disease (DJD) of lumbar spine 04/05/2016    Outpatient Encounter Prescriptions as of 04/05/2016  Medication Sig  . lisinopril (PRINIVIL,ZESTRIL) 40 MG tablet Take 40 mg by mouth daily.  Marland Kitchen. gabapentin (NEURONTIN) 300 MG capsule Take 1 capsule (300 mg total) by mouth at bedtime.  . methylPREDNISolone (MEDROL DOSEPAK) 4 MG TBPK tablet Tad  . naproxen (NAPROSYN) 500 MG tablet Take 1 tablet (500 mg total) by mouth 2 (two) times daily with a meal.   No facility-administered encounter medications on file as of 04/05/2016.     Past Medical History:  Diagnosis  Date  . Degenerative joint disease (DJD) of lumbar spine    back DJD  . Essential hypertension   . GERD (gastroesophageal reflux disease)   . Neuromuscular disorder [facility](H[facility])    sciatica    Past Surgical History:  Procedure Laterality Date  . AMPUTATION  2003   finger left hand  . BACK SURGERY     x 3  . CARPAL TUNNEL RELEASE Right   . SPINE SURGERY     2001, 2010, 2012    Social History   Social History  . Marital status: Married    Spouse name: wendy  . Number of children: 2  . Years of education: 12   Occupational History  . manufacturing    Social History Main Topics  . Smoking status: Current Every Day Smoker    Packs/day: 1.00    Types: Cigarettes    Start date: 04/01/1986  . Smokeless tobacco: Never Used  . Alcohol use No  . Drug use: No  . Sexual activity: Yes    Birth control/ protection: Post-menopausal   Other Topics Concern  . Not on file   Social History Narrative   Lives with wife Toniann FailWendy and one son    Family History  Problem Relation Age of Onset  . Arthritis Father   . Cancer Father     prostate  . Heart disease Father   . Hypertension Father   . Kidney disease Father   . Hyperlipidemia Brother   . Diabetes Paternal Grandmother   . Cancer Paternal Grandfather  prostate    Review of Systems  Constitutional: Negative for chills, fever and weight loss.  HENT: Negative for congestion and hearing loss.   Eyes: Negative for blurred vision and pain.  Respiratory: Negative for cough and shortness of breath.   Cardiovascular: Negative for chest pain and leg swelling.  Gastrointestinal: Negative for abdominal pain, constipation, diarrhea and heartburn.  Genitourinary: Negative for dysuria and frequency.  Musculoskeletal: Positive for back pain. Negative for falls, joint pain and myalgias.  Neurological: Negative for dizziness, seizures and headaches.  Psychiatric/Behavioral: Negative for depression. The patient is not nervous/anxious and does  not have insomnia.     BP (!) 150/78 (BP Location: Right Arm, Patient Position: Sitting, Cuff Size: Large)   Pulse 86   Temp 98.4 F (36.9 C) (Oral)   Resp 18   Ht 5' 11.5" (1.816 m)   Wt 248 lb 1.9 oz (112.5 kg)   SpO2 100%   BMI 34.12 kg/m   Physical Exam  Constitutional: He is oriented to person, place, and time. He appears well-developed and well-nourished.  Overweight.  Uncomfortable  HENT:  Head: Normocephalic and atraumatic.  Right Ear: External ear normal.  Left Ear: External ear normal.  Mouth/Throat: Oropharynx is clear and moist.  Eyes: Conjunctivae are normal. Pupils are equal, round, and reactive to light.  Neck: Normal range of motion. Neck supple. No thyromegaly present.  Cardiovascular: Normal rate, regular rhythm and normal heart sounds.   Pulmonary/Chest: Effort normal and breath sounds normal. No respiratory distress.  Abdominal: Soft. Bowel sounds are normal.  Musculoskeletal: Normal range of motion. He exhibits no edema.  Low back with well healed scar.  Limited ROM.  SLR bilat causes buttock pain  Lymphadenopathy:    He has no cervical adenopathy.  Neurological: He is alert and oriented to person, place, and time.  Gait antalgic  Skin: Skin is warm and dry.  Psychiatric: He has a normal mood and affect. His behavior is normal. Thought content normal.  Nursing note and vitals reviewed. ASSESSMENT/PLAN:   1. Essential hypertension Not controlled.  Will get records and see patient back for med adjustment - COMPLETE METABOLIC PANEL WITH GFR - CBC - Lipid panel - Urinalysis, Routine w reflex microscopic  2. Family history of prostate cancer - PSA  3. Generalized anxiety disorder  4. Gastroesophageal reflux disease, esophagitis presence not specified  5. Osteoarthritis of spine with radiculopathy, lumbar region Medrol pak Gabapentin Naprosyn Home exercises Discussed alternate work  6. Chronic midline low back pain with right-sided  sciatica  7. Encounter to establish care with new doctor  8. Tobacco abuse counseling    Patient Instructions  Exercise every day to your tolerance                                NEED OLD RECORDS  For your back: Take the medrol pak ( prednisone) as directed Start the naprosyn after prednisone finished Take the gabapentin at bedtime This is for nerve pain and sleep Call if not able to return to work by Monday  For your health: Try to quit smoking Come back for a physical I have ordered lab testing Eat a healthy diet   DASH Eating Plan DASH stands for "Dietary Approaches to Stop Hypertension." The DASH eating plan is a healthy eating plan that has been shown to reduce high blood pressure (hypertension). Additional health benefits may include reducing the risk of type 2 diabetes  mellitus, heart disease, and stroke. The DASH eating plan may also help with weight loss. What do I need to know about the DASH eating plan? For the DASH eating plan, you will follow these general guidelines:  Choose foods with less than 150 milligrams of sodium per serving (as listed on the food label).  Use salt-free seasonings or herbs instead of table salt or sea salt.  Check with your health care provider or pharmacist before using salt substitutes.  Eat lower-sodium products. These are often labeled as "low-sodium" or "no salt added."  Eat fresh foods. Avoid eating a lot of canned foods.  Eat more vegetables, fruits, and low-fat dairy products.  Choose whole grains. Look for the word "whole" as the first word in the ingredient list.  Choose fish and skinless chicken or [place] more often than red meat. Limit fish, poultry, and meat to 6 oz (170 g) each day.  Limit sweets, desserts, sugars, and sugary drinks.  Choose heart-healthy fats.  Eat more home-cooked food and less restaurant, buffet, and fast food.  Limit fried foods.  Do not fry foods. Cook foods using methods such as baking,  boiling, grilling, and broiling instead.  When eating at a restaurant, ask that your food be prepared with less salt, or no salt if possible. What foods can I eat? Seek help from a dietitian for individual calorie needs. Grains  Whole grain or whole wheat bread. Brown rice. Whole grain or whole wheat pasta. Quinoa, bulgur, and whole grain cereals. Low-sodium cereals. Corn or whole wheat flour tortillas. Whole grain cornbread. Whole grain crackers. Low-sodium crackers. Vegetables  Fresh or frozen vegetables (raw, steamed, roasted, or grilled). Low-sodium or reduced-sodium tomato and vegetable juices. Low-sodium or reduced-sodium tomato sauce and paste. Low-sodium or reduced-sodium canned vegetables. Fruits  All fresh, canned (in natural juice), or frozen fruits. Meat and Other Protein Products  Ground beef (85% or leaner), grass-fed beef, or beef trimmed of fat. Skinless chicken or [place]. Ground chicken or [place]. Pork trimmed of fat. All fish and seafood. Eggs. Dried beans, peas, or lentils. Unsalted nuts and seeds. Unsalted canned beans. Dairy  Low-fat dairy products, such as skim or 1% milk, 2% or reduced-fat cheeses, low-fat ricotta or cottage cheese, or plain low-fat yogurt. Low-sodium or reduced-sodium cheeses. Fats and Oils  Tub margarines without trans fats. Light or reduced-fat mayonnaise and salad dressings (reduced sodium). Avocado. Safflower, olive, or canola oils. Natural peanut or almond butter. Other  Unsalted popcorn and pretzels. The items listed above may not be a complete list of recommended foods or beverages. Contact your dietitian for more options.  What foods are not recommended? Grains  White bread. White pasta. White rice. Refined cornbread. Bagels and croissants. Crackers that contain trans fat. Vegetables  Creamed or fried vegetables. Vegetables in a cheese sauce. Regular canned vegetables. Regular canned tomato sauce and paste. Regular tomato and vegetable  juices. Fruits  Canned fruit in light or heavy syrup. Fruit juice. Meat and Other Protein Products  Fatty cuts of meat. Ribs, chicken wings, bacon, sausage, bologna, salami, chitterlings, fatback, hot dogs, bratwurst, and packaged luncheon meats. Salted nuts and seeds. Canned beans with salt. Dairy  Whole or 2% milk, cream, half-and-half, and cream cheese. Whole-fat or sweetened yogurt. Full-fat cheeses or blue cheese. Nondairy creamers and whipped toppings. Processed cheese, cheese spreads, or cheese curds. Condiments  Onion and garlic salt, seasoned salt, table salt, and sea salt. Canned and packaged gravies. Worcestershire sauce. Tartar sauce. Barbecue sauce. Teriyaki sauce. Soy  sauce, including reduced sodium. Steak sauce. Fish sauce. Oyster sauce. Cocktail sauce. Horseradish. Ketchup and mustard. Meat flavorings and tenderizers. Bouillon cubes. Hot sauce. Tabasco sauce. Marinades. Taco seasonings. Relishes. Fats and Oils  Butter, stick margarine, lard, shortening, ghee, and bacon fat. Coconut, palm kernel, or palm oils. Regular salad dressings. Other  Pickles and olives. Salted popcorn and pretzels. The items listed above may not be a complete list of foods and beverages to avoid. Contact your dietitian for more information.  Where can I find more information? National Heart, Lung, and Blood Institute: CableProm[employer].it This information is not intended to replace advice given to you by your health care provider. Make sure you discuss any questions you have with your health care provider. Document Released: 03/07/2011 Document Revised: 08/24/2015 Document Reviewed: 01/20/2013 Elsevier Interactive Patient Education  2017 Elsevier Inc.    Eustace Moore, MD

## 2016-04-06 ENCOUNTER — Encounter: Payer: Self-pay | Admitting: Family Medicine

## 2016-04-08 ENCOUNTER — Telehealth: Payer: Self-pay | Admitting: Family Medicine

## 2016-04-08 ENCOUNTER — Other Ambulatory Visit: Payer: Self-pay | Admitting: Family Medicine

## 2016-04-08 MED ORDER — HYDROCODONE-ACETAMINOPHEN 7.5-325 MG PO TABS: ORAL_TABLET | ORAL | 0 refills | Status: DC

## 2016-04-08 NOTE — Telephone Encounter (Signed)
Please call patient.  I will authorize one week off of work and have printed Rx to pick up.  If he is feeling worse, may need to come in for back eval and MRI.  Needs to be seen again to order additional testing.  His last visit was a new to est and not detailed enough for insurance purposes.

## 2016-04-08 NOTE — Telephone Encounter (Signed)
Called and spoke to pt

## 2016-04-08 NOTE — Telephone Encounter (Signed)
Pt aware, states he will call at the end of the week if he wants to be seen.

## 2016-04-12 ENCOUNTER — Encounter: Payer: Self-pay | Admitting: Family Medicine

## 2016-04-12 DIAGNOSIS — E785 Hyperlipidemia, unspecified: Secondary | ICD-10-CM | POA: Insufficient documentation

## 2016-04-12 HISTORY — DX: Hyperlipidemia, unspecified: E78.5

## 2016-04-15 ENCOUNTER — Encounter: Payer: Self-pay | Admitting: Family Medicine

## 2016-04-15 ENCOUNTER — Ambulatory Visit (INDEPENDENT_AMBULATORY_CARE_PROVIDER_SITE_OTHER): Payer: BLUE CROSS/BLUE SHIELD | Admitting: Family Medicine

## 2016-04-15 VITALS — BP 146/80 | HR 92 | Temp 97.7°F | Resp 18 | Ht 71.5 in | Wt 247.0 lb

## 2016-04-15 DIAGNOSIS — M5442 Lumbago with sciatica, left side: Secondary | ICD-10-CM | POA: Diagnosis not present

## 2016-04-15 DIAGNOSIS — Z720 Tobacco use: Secondary | ICD-10-CM

## 2016-04-15 DIAGNOSIS — E785 Hyperlipidemia, unspecified: Secondary | ICD-10-CM

## 2016-04-15 DIAGNOSIS — Z0000 Encounter for general adult medical examination without abnormal findings: Secondary | ICD-10-CM

## 2016-04-15 DIAGNOSIS — M5441 Lumbago with sciatica, right side: Secondary | ICD-10-CM

## 2016-04-15 DIAGNOSIS — I10 Essential (primary) hypertension: Secondary | ICD-10-CM

## 2016-04-15 DIAGNOSIS — G8929 Other chronic pain: Secondary | ICD-10-CM

## 2016-04-15 MED ORDER — ATORVASTATIN CALCIUM 40 MG PO TABS: 40.0000 mg | ORAL_TABLET | Freq: Every day | ORAL | 3 refills | Status: DC

## 2016-04-15 MED ORDER — HYDROCODONE-ACETAMINOPHEN 7.5-325 MG PO TABS: ORAL_TABLET | ORAL | 0 refills | Status: DC

## 2016-04-15 MED ORDER — GABAPENTIN 300 MG PO CAPS: 300.0000 mg | ORAL_CAPSULE | Freq: Three times a day (TID) | ORAL | 0 refills | Status: DC

## 2016-04-15 NOTE — Progress Notes (Signed)
Chief Complaint  Patient presents with  . Annual Exam   bp control not optimal.  Patient is in pain and sleep deprived today. He needs to be on avstatin.  His Framingham risk calculates at 25 % for CVD in the next 10 years I have discussed the multiple health risks associated with cigarette smoking including, but not limited to, cardiovascular disease, lung disease and cancer.  I have strongly recommended that smoking be stopped.  I have reviewed the various methods of quitting including cold [place]key, classes, nicotine replacements and prescription medications.  I have offered assistance in this difficult process.  The patient is not interested in assistance at this time.  I specifically discussed th e importance of being a NON smoker if he is to have any spine procedure Still with acute left sided sciatica.  minor improvement with medrol.  Pain came back when med stopped.  Is scheduled to see his back surgeon next week   Patient Active Problem List   Diagnosis Date Noted  . HLD (hyperlipidemia) 04/12/2016  . Essential hypertension 04/05/2016  . Family history of prostate cancer 04/05/2016  . Generalized anxiety disorder 04/05/2016  . GERD (gastroesophageal reflux disease) 04/05/2016  . Degenerative joint disease (DJD) of lumbar spine 04/05/2016  . Tobacco abuse 04/05/2016    Outpatient Encounter Prescriptions as of 04/15/2016  Medication Sig  . amLODipine (NORVASC) 5 MG tablet Take 5 mg by mouth daily.  Marland Kitchen. gabapentin (NEURONTIN) 300 MG capsule Take 1 capsule (300 mg total) by mouth 3 (three) times daily.  Marland Kitchen. HYDROcodone-acetaminophen (NORCO) 7.5-325 MG tablet One tablet every 6 hours as needed severe pain  . lisinopril (PRINIVIL,ZESTRIL) 40 MG tablet Take 40 mg by mouth daily.  . naproxen (NAPROSYN) 500 MG tablet Take 1 tablet (500 mg total) by mouth 2 (two) times daily with a meal.  . atorvastatin (LIPITOR) 40 MG tablet Take 1 tablet (40 mg total) by mouth daily.   No  facility-administered encounter medications on file as of 04/15/2016.     Allergies  Allergen Reactions  . Erythromycin Palpitations    Review of Systems  Constitutional: Positive for activity change. Negative for appetite change.  HENT: Negative for congestion and dental problem.   Eyes: Negative for photophobia and visual disturbance.  Respiratory: Negative for cough and shortness of breath.   Cardiovascular: Negative for chest pain, palpitations and leg swelling.  Gastrointestinal: Negative for constipation and diarrhea.  Genitourinary: Negative for difficulty urinating and frequency.  Musculoskeletal: Positive for back pain and gait problem.  Neurological: Negative for dizziness and headaches.  Psychiatric/Behavioral: Positive for sleep disturbance. Negative for dysphoric mood. The patient is not nervous/anxious.     BP (!) 146/80 (BP Location: Right Arm, Cuff Size: Large)   Pulse 92   Temp 97.7 F (36.5 C) (Oral)   Resp 18   Ht 5' 11.5" (1.816 m)   Wt 247 lb 0.6 oz (112.1 kg)   SpO2 99%   BMI 33.97 kg/m   Physical Exam BP (!) 146/80 (BP Location: Right Arm, Cuff Size: Large)   Pulse 92   Temp 97.7 F (36.5 C) (Oral)   Resp 18   Ht 5' 11.5" (1.816 m)   Wt 247 lb 0.6 oz (112.1 kg)   SpO2 99%   BMI 33.97 kg/m   General Appearance:    Alert, cooperative, mild distress due to pain, appears stated age  Head:    Normocephalic, without obvious abnormality, atraumatic  Eyes:    PERRL, conjunctiva/corneas  clear, EOM's intact, fundi    benign, both eyes       Ears:    Normal TM's and external ear canals, both ears  Nose:   Nares normal, septum midline, mucosa normal, no drainage   or sinus tenderness  Throat:   Lips, mucosa, and tongue normal; teeth and gums normal  Neck:   Supple, symmetrical, trachea midline, no adenopathy;       thyroid:  No enlargement/tenderness/nodules; no carotid   bruit  Back:     Symmetric, no curvature, ROM stiff.  Well healed scar.      Lungs:     Clear to auscultation bilaterally, respirations unlabored  Chest wall:    No tenderness or deformity  Heart:    Regular rate and rhythm, S1 and S2 normal, no murmur, rub   or gallop   EKG normal  Abdomen:     Soft, non-tender, bowel sounds active all four quadrants,    no masses, no organomegaly.  Obese  Extremities:   Extremities normal, atraumatic, no cyanosis or edema.  Reflexes dull at knee ant ankle  Pulses:   2+ and symmetric all extremities  Skin:   Skin color, texture, turgor normal, no rashes or lesions  Lymph nodes:   Cervical, supraclavicular, and axillary nodes normal  Neurologic:   Normal strength, sensation    throughout    ASSESSMENT/PLAN:  1. Chronic midline low back pain with right-sided sciatica To see specialist.  2. Acute left-sided back pain with sciatica   3. Annual physical exam   4. Tobacco abuse   5. Essential hypertension  - EKG 12-Lead  6. Hyperlipidemia, unspecified hyperlipidemia type  - Lipid Profile   Patient Instructions  Take naproxen twice a day Take the gabapentin 3 times a day Take the hydrocodone for severe pain see Dr Newell Coral next week  Need heart healthy diet Once your back is better, exercise is important Take the atorvastatin once a day in the evening Continue blood pressure medicine in the morning  IF Dr Newell Coral recommends any back surgery or treatment, it is important that you NOT SMOKE to improve your chances of recovery.  Please continue to try to quit.  Let me know if you need chantix or assistance  See me in 3 months with labs prior     Eustace Moore, MD

## 2016-04-15 NOTE — Patient Instructions (Signed)
Take naproxen twice a day Take the gabapentin 3 times a day Take the hydrocodone for severe pain see Dr Newell CoralNudelman next week  Need heart healthy diet Once your back is better, exercise is important Take the atorvastatin once a day in the evening Continue blood pressure medicine in the morning  IF Dr Newell CoralNudelman recommends any back surgery or treatment, it is important that you NOT SMOKE to improve your chances of recovery.  Please continue to try to quit.  Let me know if you need chantix or assistance  See me in 3 months with labs prior

## 2016-04-19 ENCOUNTER — Telehealth: Payer: Self-pay | Admitting: Family Medicine

## 2016-04-19 NOTE — Telephone Encounter (Signed)
Spoke to [place], aware too soon to refill norco.

## 2016-04-19 NOTE — Telephone Encounter (Signed)
Terrence Robinson left message requesting a refill of his norco.  Lorain ChildesFYI this was written 1 15 18  by you.

## 2016-04-19 NOTE — Telephone Encounter (Signed)
Refused, too soon.  He was told to make it last until he saw specialist.

## 2016-04-29 ENCOUNTER — Telehealth: Payer: Self-pay | Admitting: Family Medicine

## 2016-04-29 NOTE — Telephone Encounter (Signed)
Terrence Robinson is calling asking for something for pain, he is stating he just left the Neurosurgeons office and he didn't give him anything, he states his surgery is scheduled for Feb 14th please advise?

## 2016-04-29 NOTE — Telephone Encounter (Signed)
Call neurosurgeon for management.

## 2016-04-29 NOTE — Telephone Encounter (Signed)
Patient aware.  States that he did request something today when he saw neurosurgeon and was denied.   States that he will try to go without anything if he has to but did want to let you know this.

## 2016-05-09 ENCOUNTER — Telehealth: Payer: Self-pay

## 2016-05-09 ENCOUNTER — Other Ambulatory Visit: Payer: Self-pay | Admitting: Family Medicine

## 2016-05-14 NOTE — Telephone Encounter (Signed)
No response received.

## 2016-06-04 ENCOUNTER — Telehealth: Payer: Self-pay

## 2016-06-05 NOTE — Telephone Encounter (Signed)
Disability forms filled out and faxed.

## 2016-06-18 ENCOUNTER — Encounter: Payer: Self-pay | Admitting: Orthopaedic Surgery

## 2016-06-18 ENCOUNTER — Ambulatory Visit (INDEPENDENT_AMBULATORY_CARE_PROVIDER_SITE_OTHER): Payer: BLUE CROSS/BLUE SHIELD | Admitting: Orthopaedic Surgery

## 2016-06-18 ENCOUNTER — Ambulatory Visit (INDEPENDENT_AMBULATORY_CARE_PROVIDER_SITE_OTHER): Payer: BLUE CROSS/BLUE SHIELD

## 2016-06-18 VITALS — BP 148/94 | HR 114 | Temp 98.6°F | Ht 71.5 in | Wt 246.0 lb

## 2016-06-18 DIAGNOSIS — F17210 Nicotine dependence, cigarettes, uncomplicated: Secondary | ICD-10-CM

## 2016-06-18 DIAGNOSIS — G8929 Other chronic pain: Secondary | ICD-10-CM | POA: Diagnosis not present

## 2016-06-18 DIAGNOSIS — M25561 Pain in right knee: Secondary | ICD-10-CM

## 2016-06-18 NOTE — Patient Instructions (Addendum)
Steps to Quit Smoking Smoking tobacco can be bad for your health. It can also affect almost every organ in your body. Smoking puts you and people around you at risk for many serious Terrence Robinson-lasting (chronic) diseases. Quitting smoking is hard, but it is one of the best things that you can do for your health. It is never too late to quit. What are the benefits of quitting smoking? When you quit smoking, you lower your risk for getting serious diseases and conditions. They can include:  Lung cancer or lung disease.  Heart disease.  Stroke.  Heart attack.  Not being able to have children (infertility).  Weak bones (osteoporosis) and broken bones (fractures). If you have coughing, wheezing, and shortness of breath, those symptoms may get better when you quit. You may also get sick less often. If you are pregnant, quitting smoking can help to lower your chances of having a baby of low birth weight. What can I do to help me quit smoking? Talk with your doctor about what can help you quit smoking. Some things you can do (strategies) include:  Quitting smoking totally, instead of slowly cutting back how much you smoke over a period of time.  Going to in-person counseling. You are more likely to quit if you go to many counseling sessions.  Using resources and support systems, such as:  Online chats with a counselor.  Phone quitlines.  Printed self-help materials.  Support groups or group counseling.  Text messaging programs.  Mobile phone apps or applications.  Taking medicines. Some of these medicines may have nicotine in them. If you are pregnant or breastfeeding, do not take any medicines to quit smoking unless your doctor says it is okay. Talk with your doctor about counseling or other things that can help you. Talk with your doctor about using more than one strategy at the same time, such as taking medicines while you are also going to in-person counseling. This can help make quitting  easier. What things can I do to make it easier to quit? Quitting smoking might feel very hard at first, but there is a lot that you can do to make it easier. Take these steps:  Talk to your family and friends. Ask them to support and encourage you.  Call phone quitlines, reach out to support groups, or work with a counselor.  Ask people who smoke to not smoke around you.  Avoid places that make you want (trigger) to smoke, such as:  Bars.  Parties.  Smoke-break areas at work.  Spend time with people who do not smoke.  Lower the stress in your life. Stress can make you want to smoke. Try these things to help your stress:  Getting regular exercise.  Deep-breathing exercises.  Yoga.  Meditating.  Doing a body scan. To do this, close your eyes, focus on one area of your body at a time from head to toe, and notice which parts of your body are tense. Try to relax the muscles in those areas.  Download or buy apps on your mobile phone or tablet that can help you stick to your quit plan. There are many free apps, such as QuitGuide from the CDC (Centers for Disease Control and Prevention). You can find more support from smokefree.gov and other websites. This information is not intended to replace advice given to you by your health care provider. Make sure you discuss any questions you have with your health care provider. Document Released: 01/12/2009 Document Revised: 11/14/2015 Document   Reviewed: 08/02/2014 Elsevier Interactive Patient Education  2017 Elsevier Inc.  

## 2016-06-18 NOTE — Progress Notes (Signed)
Subjective:    Patient ID: Terrence Robinson, male    DOB: 10/09/1965, 51 y.o.   MRN: [id number]  HPI He has had right knee pain on and off for years.  He saw Dr. Regino SchultzeMcGough before he retired for the right knee.  He had a brace.  The patient had lower back surgery in February, his fourth one.   Dr. Jonnie FinnerNudlemann did the surgery.  He has been told to walk.  His right knee started hurting more.  He has swelling and popping but no giving way.  He has been released for his back recently.  He saw Dr. Delton SeeNelson and was referred here.  He has tried ice, heat, rest, Tylenol with little help.  He is on Naprosyn and gabapentin.  He smokes and is not willing to quit.  Review of Systems  HENT: Negative for congestion.   Respiratory: Negative for cough and shortness of breath.   Cardiovascular: Negative for chest pain and leg swelling.  Endocrine: Negative for cold intolerance.  Musculoskeletal: Positive for arthralgias, back pain, gait problem and joint swelling.  Allergic/Immunologic: Negative for environmental allergies.   Past Medical History:  Diagnosis Date  . Degenerative joint disease (DJD) of lumbar spine    back DJD  . Essential hypertension   . GERD (gastroesophageal reflux disease)   . HLD (hyperlipidemia) 04/12/2016  . Hypertension   . Neuromuscular disorder (HCC)    sciatica  . Pneumonia   . Tobacco abuse 04/05/2016    Past Surgical History:  Procedure Laterality Date  . AMPUTATION  2003   finger left hand  . BACK SURGERY     x 3  . CARPAL TUNNEL RELEASE Right   . SPINE SURGERY     2001, 2010, 2012    Current Outpatient Prescriptions on File Prior to Visit  Medication Sig Dispense Refill  . amLODipine (NORVASC) 5 MG tablet Take 5 mg by mouth daily.    Marland Kitchen. atorvastatin (LIPITOR) 40 MG tablet Take 1 tablet (40 mg total) by mouth daily. 90 tablet 3  . gabapentin (NEURONTIN) 300 MG capsule Take 1 capsule (300 mg total) by mouth 3 (three) times daily. 90 capsule 0  .  HYDROcodone-acetaminophen (NORCO) 7.5-325 MG tablet One tablet every 6 hours as needed severe pain 20 tablet 0  . lisinopril (PRINIVIL,ZESTRIL) 40 MG tablet Take 40 mg by mouth daily.    . naproxen (NAPROSYN) 500 MG tablet TAKE 1 TABLET BY MOUTH TWICE DAILY WITH MEALS. 60 tablet 3   No current facility-administered medications on file prior to visit.     Social History   Social History  . Marital status: Married    Spouse name: wendy  . Number of children: 2  . Years of education: 12   Occupational History  . manufacturing    Social History Main Topics  . Smoking status: Current Every Day Smoker    Packs/day: 1.00    Types: Cigarettes    Start date: 04/01/1986  . Smokeless tobacco: Never Used  . Alcohol use No  . Drug use: No  . Sexual activity: Yes    Birth control/ protection: Post-menopausal   Other Topics Concern  . Not on file   Social History Narrative   Lives with wife Toniann FailWendy and one son    Family History  Problem Relation Age of Onset  . Arthritis Father   . Cancer Father     prostate  . Heart disease Father   . Hypertension Father   .  Kidney disease Father   . Hyperlipidemia Brother   . Diabetes Paternal Grandmother   . Cancer Paternal Grandfather     prostate    BP (!) 148/94   Pulse (!) 114   Temp 98.6 F (37 C)   Ht 5' 11.5" (1.816 m)   Wt 246 lb (111.6 kg)   BMI 33.83 kg/m      Objective:   Physical Exam  Constitutional: He is oriented to person, place, and time. He appears well-developed and well-nourished.  HENT:  Head: Normocephalic and atraumatic.  Eyes: Conjunctivae and EOM are normal. Pupils are equal, round, and reactive to light.  Neck: Normal range of motion. Neck supple.  Cardiovascular: Normal rate, regular rhythm and intact distal pulses.   Pulmonary/Chest: Effort normal.  Abdominal: Soft.  Musculoskeletal: He exhibits tenderness (Right knee with slight effusion, ROM 0 to 110, slight lrmp right, knee stable; left knee  negative.).  Neurological: He is alert and oriented to person, place, and time. He has normal reflexes. He displays normal reflexes. No cranial nerve deficit. He exhibits normal muscle tone. Coordination normal.  Skin: Skin is warm and dry.  Psychiatric: He has a normal mood and affect. His behavior is normal. Judgment and thought content normal.  Vitals reviewed.  X-rays were done of the right knee, reported separately.       Assessment & Plan:   Encounter Diagnoses  Name Primary?  . Chronic pain of right knee Yes  . Cigarette nicotine dependence without complication    PROCEDURE NOTE:  The patient requests injections of the right knee , verbal consent was obtained.  The right knee was prepped appropriately after time out was performed.   Sterile technique was observed and injection of 1 cc of Depo-Medrol 40 mg with several cc's of plain xylocaine. Anesthesia was provided by ethyl chloride and a 20-gauge needle was used to inject the knee area. The injection was tolerated well.  A band aid dressing was applied.  The patient was advised to apply ice later today and tomorrow to the injection sight as needed.   I will see him in one month.  Call if any problem.  Precautions discussed.  Electronically Signed Darreld Mclean, MD 3/20/20181:54 PM

## 2016-06-25 ENCOUNTER — Other Ambulatory Visit: Payer: Self-pay | Admitting: Family Medicine

## 2016-06-25 ENCOUNTER — Telehealth: Payer: Self-pay | Admitting: Family Medicine

## 2016-06-25 MED ORDER — LISINOPRIL-HYDROCHLOROTHIAZIDE 20-12.5 MG PO TABS: 2.0000 | ORAL_TABLET | Freq: Every day | ORAL | 3 refills | Status: DC

## 2016-06-25 NOTE — Telephone Encounter (Signed)
Terrence Robinson is calling stating that he is getting ready to run out of his Amlodipine and Lisinopril and he stated that Dr. Delton SeeNelson suggested when he ran out to change to BP and Fluid in 1 pill, Please advise?

## 2016-06-25 NOTE — Telephone Encounter (Signed)
Done, to pharmacy

## 2016-06-26 NOTE — Telephone Encounter (Signed)
Left message for Terrence Robinson.

## 2016-07-10 LAB — LIPID PANEL
CHOL/HDL RATIO: 2.4 ratio (ref <5.0)
CHOLESTEROL: 106 mg/dL (ref <200)
HDL: 44 mg/dL (ref >40)
LDL CALC: 44 mg/dL (ref <100)
Triglycerides: 92 mg/dL (ref <150)
VLDL: 18 mg/dL (ref <30)

## 2016-07-15 ENCOUNTER — Ambulatory Visit: Payer: Self-pay | Admitting: Family Medicine

## 2016-07-16 ENCOUNTER — Ambulatory Visit: Payer: BLUE CROSS/BLUE SHIELD | Admitting: Orthopaedic Surgery

## 2016-07-18 ENCOUNTER — Ambulatory Visit: Payer: Self-pay | Admitting: Family Medicine

## 2016-07-18 ENCOUNTER — Ambulatory Visit: Payer: BLUE CROSS/BLUE SHIELD | Admitting: Orthopaedic Surgery

## 2016-07-19 ENCOUNTER — Encounter: Payer: Self-pay | Admitting: Family Medicine

## 2016-07-19 ENCOUNTER — Ambulatory Visit (INDEPENDENT_AMBULATORY_CARE_PROVIDER_SITE_OTHER): Payer: BLUE CROSS/BLUE SHIELD | Admitting: Family Medicine

## 2016-07-19 VITALS — BP 138/86 | HR 80 | Temp 98.6°F | Resp 18 | Ht 72.0 in | Wt 249.1 lb

## 2016-07-19 DIAGNOSIS — M4726 Other spondylosis with radiculopathy, lumbar region: Secondary | ICD-10-CM | POA: Diagnosis not present

## 2016-07-19 DIAGNOSIS — I10 Essential (primary) hypertension: Secondary | ICD-10-CM | POA: Diagnosis not present

## 2016-07-19 DIAGNOSIS — E785 Hyperlipidemia, unspecified: Secondary | ICD-10-CM | POA: Diagnosis not present

## 2016-07-19 DIAGNOSIS — Z720 Tobacco use: Secondary | ICD-10-CM | POA: Diagnosis not present

## 2016-07-19 MED ORDER — DULOXETINE HCL 30 MG PO CPEP: 30.0000 mg | ORAL_CAPSULE | Freq: Every day | ORAL | 3 refills | Status: DC

## 2016-07-19 MED ORDER — NABUMETONE 750 MG PO TABS: 750.0000 mg | ORAL_TABLET | Freq: Two times a day (BID) | ORAL | 3 refills | Status: DC

## 2016-07-19 MED ORDER — GABAPENTIN 400 MG PO CAPS: 400.0000 mg | ORAL_CAPSULE | Freq: Three times a day (TID) | ORAL | 3 refills | Status: DC

## 2016-07-19 NOTE — Patient Instructions (Signed)
Take the nabumetone twice a day Stop naprosyn Take the gabapentin 3 times a day Take the duloxetine once a day I will place pain medicine referral  See me in 3 months  Try to quit smoking

## 2016-07-19 NOTE — Progress Notes (Signed)
Chief Complaint  Patient presents with  . Follow-up    3 month   Routine follow-up. He is compliant with his blood pressure and cholesterol medication. Follow-up cholesterol testing showed dramatic improvement. Blood pressure is usually well controlled at home. GERD not causing symptoms. He continues to smoke cigarettes. He is advised again to quit. His biggest complaint remains low back pain. He is under the care of a back surgeon. He had a discectomy in February. He continues to have back and leg pain. He is scheduled to have an epidural steroid. He tells me he needs more pain medication. I am referring him today for pain management. We had a discussion regarding pain management. Long-term use of opioids is not recommended. I am changing his NSAID. I am adding duloxetine. In addition he is going back on gabapentin. The importance of exercise and activity is emphasized to him.  Patient Active Problem List   Diagnosis Date Noted  . HLD (hyperlipidemia) 04/12/2016  . Essential hypertension 04/05/2016  . Family history of prostate cancer 04/05/2016  . Generalized anxiety disorder 04/05/2016  . GERD (gastroesophageal reflux disease) 04/05/2016  . Degenerative joint disease (DJD) of lumbar spine 04/05/2016  . Tobacco abuse 04/05/2016    Outpatient Encounter Prescriptions as of 07/19/2016  Medication Sig  . aspirin EC 81 MG tablet Take 81 mg by mouth daily.  Marland Kitchen atorvastatin (LIPITOR) 40 MG tablet Take 1 tablet (40 mg total) by mouth daily.  Marland Kitchen lisinopril-hydrochlorothiazide (ZESTORETIC) 20-12.5 MG tablet Take 2 tablets by mouth daily.  . [DISCONTINUED] naproxen (NAPROSYN) 500 MG tablet TAKE 1 TABLET BY MOUTH TWICE DAILY WITH MEALS.  . DULoxetine (CYMBALTA) 30 MG capsule Take 1 capsule (30 mg total) by mouth daily.  Marland Kitchen gabapentin (NEURONTIN) 400 MG capsule Take 1 capsule (400 mg total) by mouth 3 (three) times daily.  . nabumetone (RELAFEN) 750 MG tablet Take 1 tablet (750 mg total) by mouth  2 (two) times daily.   No facility-administered encounter medications on file as of 07/19/2016.     Allergies  Allergen Reactions  . Erythromycin Palpitations    Review of Systems  Constitutional: Negative for activity change and appetite change.  HENT: Negative for congestion and dental problem.   Eyes: Negative for photophobia and visual disturbance.  Respiratory: Negative for cough and shortness of breath.   Cardiovascular: Negative for chest pain, palpitations and leg swelling.  Gastrointestinal: Negative for constipation and diarrhea.  Genitourinary: Negative for difficulty urinating and frequency.  Musculoskeletal: Positive for back pain and gait problem.  Neurological: Negative for dizziness and headaches.  Psychiatric/Behavioral: Positive for dysphoric mood. Negative for sleep disturbance. The patient is not nervous/anxious.     BP 138/86   Pulse 80   Temp 98.6 F (37 C) (Temporal)   Resp 18   Ht 6' (1.829 m)   Wt 249 lb 1.3 oz (113 kg)   SpO2 100%   BMI 33.78 kg/m   Physical Exam  Constitutional: He is oriented to person, place, and time. He appears well-developed and well-nourished.  Overweight.  Uncomfortable  HENT:  Head: Normocephalic and atraumatic.  Mouth/Throat: Oropharynx is clear and moist.  Eyes: Conjunctivae are normal. Pupils are equal, round, and reactive to light.  Neck: Normal range of motion. Neck supple. No thyromegaly present.  Cardiovascular: Normal rate, regular rhythm and normal heart sounds.   Pulmonary/Chest: Effort normal and breath sounds normal. No respiratory distress.  Musculoskeletal: Normal range of motion. He exhibits no edema.  Low back with  well healed scar. No Focal Neurologic findings  Lymphadenopathy:    He has no cervical adenopathy.  Neurological: He is alert and oriented to person, place, and time.  Gait antalgic  Skin: Skin is warm and dry.  Psychiatric: He has a normal mood and affect. His behavior is normal. Thought  content normal.  Nursing note and vitals reviewed.   ASSESSMENT/PLAN:  1. Essential hypertension controlled  2. Osteoarthritis of spine with radiculopathy, lumbar region Under care spine specialist.  Pain not controlled per pt. - Ambulatory referral to Pain Clinic  3. Tobacco abuse discussed  4. Hyperlipidemia, unspecified hyperlipidemia type Well controlled    Patient Instructions  Take the nabumetone twice a day Stop naprosyn Take the gabapentin 3 times a day Take the duloxetine once a day I will place pain medicine referral  See me in 3 months  Try to quit smoking   Eustace Moore, MD

## 2016-08-15 ENCOUNTER — Other Ambulatory Visit: Payer: Self-pay | Admitting: Family Medicine

## 2016-08-15 ENCOUNTER — Telehealth: Payer: Self-pay | Admitting: Family Medicine

## 2016-08-15 NOTE — Telephone Encounter (Signed)
Spoke with patient he states that he called Dr.Doonquah's office to discuss his referral and they said that they have not received a referral. I will followup on this tomorrow. Patient gave me permission to discuss with his wife wendy tomorrow

## 2016-10-09 ENCOUNTER — Other Ambulatory Visit: Payer: Self-pay | Admitting: Family Medicine

## 2016-10-15 ENCOUNTER — Ambulatory Visit: Payer: BLUE CROSS/BLUE SHIELD | Admitting: Family Medicine

## 2016-10-17 ENCOUNTER — Ambulatory Visit: Payer: BLUE CROSS/BLUE SHIELD | Admitting: Family Medicine

## 2016-10-18 ENCOUNTER — Ambulatory Visit: Payer: BLUE CROSS/BLUE SHIELD | Admitting: Family Medicine

## 2016-12-24 ENCOUNTER — Other Ambulatory Visit: Payer: Self-pay | Admitting: Family Medicine

## 2016-12-31 ENCOUNTER — Ambulatory Visit (INDEPENDENT_AMBULATORY_CARE_PROVIDER_SITE_OTHER): Payer: 59 | Admitting: Family Medicine

## 2016-12-31 ENCOUNTER — Encounter: Payer: Self-pay | Admitting: Family Medicine

## 2016-12-31 VITALS — BP 138/88 | HR 80 | Temp 99.1°F | Resp 16 | Ht 72.0 in | Wt 265.1 lb

## 2016-12-31 DIAGNOSIS — E785 Hyperlipidemia, unspecified: Secondary | ICD-10-CM

## 2016-12-31 DIAGNOSIS — Z720 Tobacco use: Secondary | ICD-10-CM | POA: Diagnosis not present

## 2016-12-31 DIAGNOSIS — I10 Essential (primary) hypertension: Secondary | ICD-10-CM

## 2016-12-31 DIAGNOSIS — M4726 Other spondylosis with radiculopathy, lumbar region: Secondary | ICD-10-CM

## 2016-12-31 NOTE — Patient Instructions (Signed)
You are doing well  See me in January for labs and a physical

## 2016-12-31 NOTE — Progress Notes (Signed)
Chief Complaint  Patient presents with  . Follow-up   Sees pain clinic They changed his gabapentin to lyrica and he gained weight and had headaches daily.  They then placed him back on gabapentin.  He had had hydrocodone for severe pain which he states 'didn't work".  They have doubled his cymbalta to 60 mg a day. He is working every day He continues to smoke but has a plan to reduce and quit by January. BP well controlled Lipids responded nicely to a statin He gained weight and we discussed reducing carbs and sweets to try to lose.  Patient Active Problem List   Diagnosis Date Noted  . HLD (hyperlipidemia) 04/12/2016  . Essential hypertension 04/05/2016  . Family history of prostate cancer 04/05/2016  . Generalized anxiety disorder 04/05/2016  . GERD (gastroesophageal reflux disease) 04/05/2016  . Degenerative joint disease (DJD) of lumbar spine 04/05/2016  . Tobacco abuse 04/05/2016    Outpatient Encounter Prescriptions as of 12/31/2016  Medication Sig  . atorvastatin (LIPITOR) 40 MG tablet TAKE 1 TABLET BY MOUTH ONCE A DAY.  . DULoxetine (CYMBALTA) 30 MG capsule TAKE (1) CAPSULE BY MOUTH ONCE DAILY.  Marland Kitchen gabapentin (NEURONTIN) 400 MG capsule Take 1 capsule (400 mg total) by mouth 3 (three) times daily.  Marland Kitchen lisinopril-hydrochlorothiazide (ZESTORETIC) 20-12.5 MG tablet Take 2 tablets by mouth daily.  . nabumetone (RELAFEN) 750 MG tablet TAKE (1) TABLET BY MOUTH TWICE DAILY.  Marland Kitchen tiZANidine (ZANAFLEX) 4 MG tablet    No facility-administered encounter medications on file as of 12/31/2016.     Allergies  Allergen Reactions  . Erythromycin Palpitations   Review of Systems  Constitutional: Negative for activity change and appetite change.  HENT: Negative for congestion and dental problem.   Eyes: Negative for photophobia and visual disturbance.  Respiratory: Negative for cough and shortness of breath.   Cardiovascular: Negative for chest pain, palpitations and leg swelling.    Gastrointestinal: Negative for constipation and diarrhea.  Genitourinary: Negative for difficulty urinating and frequency.  Musculoskeletal: Positive for back pain and gait problem.       Chronic.  Some R leg weakness  Neurological: Negative for dizziness and headaches.  Psychiatric/Behavioral: Negative for dysphoric mood and sleep disturbance. The patient is not nervous/anxious.     BP 138/88   Pulse 80   Temp 99.1 F (37.3 C) (Temporal)   Resp 16   Ht 6' (1.829 m)   Wt 265 lb 1.9 oz (120.3 kg)   SpO2 98%   BMI 35.96 kg/m   Physical Exam  Constitutional: He is oriented to person, place, and time. He appears well-developed and well-nourished.  Overweight.   HENT:  Head: Normocephalic and atraumatic.  Mouth/Throat: Oropharynx is clear and moist.  Eyes: Pupils are equal, round, and reactive to light. Conjunctivae are normal.  Neck: Normal range of motion. Neck supple. No thyromegaly present.  Cardiovascular: Normal rate, regular rhythm and normal heart sounds.   Pulmonary/Chest: Effort normal and breath sounds normal. No respiratory distress.  Musculoskeletal: Normal range of motion. He exhibits no edema.  Low back with well healed scar.   Lymphadenopathy:    He has no cervical adenopathy.  Neurological: He is alert and oriented to person, place, and time.  Gait antalgic- minimal  Skin: Skin is warm and dry.  Psychiatric: He has a normal mood and affect. His behavior is normal. Thought content normal.  Nursing note and vitals reviewed.   ASSESSMENT/PLAN:  1. Essential hypertension - CBC - COMPLETE  METABOLIC PANEL WITH GFR - Urinalysis, Routine w reflex microscopic  2. Osteoarthritis of spine with radiculopathy, lumbar region  3. Tobacco abuse  4. Hyperlipidemia, unspecified hyperlipidemia type - Lipid panel  discussed weight loss and benefit to health and back pain  Patient Instructions  You are doing well  See me in January for labs and a  physical   Eustace Moore, MD

## 2017-01-20 ENCOUNTER — Telehealth: Payer: Self-pay | Admitting: Family Medicine

## 2017-01-20 NOTE — Telephone Encounter (Signed)
Patient states insurance company will not cover a 30 day supply of blood pressure nor cholesterol medication. Needs 90 day supply.  Also they said he has to use [facility] or mail order. Cb#: [phone] [phone]

## 2017-01-21 MED ORDER — ATORVASTATIN CALCIUM 40 MG PO TABS: 40.0000 mg | ORAL_TABLET | Freq: Every day | ORAL | 3 refills | Status: DC

## 2017-01-21 MED ORDER — LISINOPRIL-HYDROCHLOROTHIAZIDE 20-12.5 MG PO TABS: 2.0000 | ORAL_TABLET | Freq: Every day | ORAL | 3 refills | Status: DC

## 2017-01-21 NOTE — Telephone Encounter (Signed)
Seen 10 2 18 

## 2017-04-02 LAB — URINALYSIS, ROUTINE W REFLEX MICROSCOPIC
Bilirubin Urine: NEGATIVE
Glucose, UA: NEGATIVE
Hgb urine dipstick: NEGATIVE
Leukocytes, UA: NEGATIVE
Nitrite: NEGATIVE
Protein, ur: NEGATIVE
Specific Gravity, Urine: 1.027 (ref 1.001–1.03)
pH: 6 (ref 5.0–8.0)

## 2017-04-02 LAB — COMPLETE METABOLIC PANEL WITHOUT GFR
AG Ratio: 2.1 (calc) (ref 1.0–2.5)
ALT: 43 U/L (ref 9–46)
AST: 22 U/L (ref 10–35)
Albumin: 4.5 g/dL (ref 3.6–5.1)
Alkaline phosphatase (APISO): 54 U/L (ref 40–115)
BUN: 14 mg/dL (ref 7–25)
CO2: 30 mmol/L (ref 20–32)
Calcium: 9.9 mg/dL (ref 8.6–10.3)
Chloride: 102 mmol/L (ref 98–110)
Creat: 0.73 mg/dL (ref 0.70–1.33)
GFR, Est African American: 125 mL/min/1.73m2
GFR, Est Non African American: 108 mL/min/1.73m2
Globulin: 2.1 g/dL (ref 1.9–3.7)
Glucose, Bld: 139 mg/dL — ABNORMAL HIGH (ref 65–99)
Potassium: 4.4 mmol/L (ref 3.5–5.3)
Sodium: 138 mmol/L (ref 135–146)
Total Bilirubin: 0.3 mg/dL (ref 0.2–1.2)
Total Protein: 6.6 g/dL (ref 6.1–8.1)

## 2017-04-02 LAB — LIPID PANEL
Cholesterol: 124 mg/dL
HDL: 46 mg/dL
LDL Cholesterol (Calc): 61 mg/dL
Non-HDL Cholesterol (Calc): 78 mg/dL
Total CHOL/HDL Ratio: 2.7 (calc)
Triglycerides: 83 mg/dL

## 2017-04-02 LAB — CBC
HCT: 43.6 % (ref 38.5–50.0)
Hemoglobin: 14.9 g/dL (ref 13.2–17.1)
MCH: 29.7 pg (ref 27.0–33.0)
MCHC: 34.2 g/dL (ref 32.0–36.0)
MCV: 87 fL (ref 80.0–100.0)
MPV: 9.9 fL (ref 7.5–12.5)
Platelets: 347 Thousand/uL (ref 140–400)
RBC: 5.01 Million/uL (ref 4.20–5.80)
RDW: 12.3 % (ref 11.0–15.0)
WBC: 6.5 Thousand/uL (ref 3.8–10.8)

## 2017-04-07 ENCOUNTER — Ambulatory Visit (INDEPENDENT_AMBULATORY_CARE_PROVIDER_SITE_OTHER): Payer: 59 | Admitting: Family Medicine

## 2017-04-07 ENCOUNTER — Encounter: Payer: Self-pay | Admitting: Family Medicine

## 2017-04-07 ENCOUNTER — Other Ambulatory Visit: Payer: Self-pay

## 2017-04-07 VITALS — BP 138/88 | HR 92 | Temp 97.9°F | Resp 18 | Ht 72.0 in | Wt 261.0 lb

## 2017-04-07 DIAGNOSIS — Z1211 Encounter for screening for malignant neoplasm of colon: Secondary | ICD-10-CM

## 2017-04-07 DIAGNOSIS — Z0000 Encounter for general adult medical examination without abnormal findings: Secondary | ICD-10-CM | POA: Diagnosis not present

## 2017-04-07 DIAGNOSIS — I10 Essential (primary) hypertension: Secondary | ICD-10-CM | POA: Diagnosis not present

## 2017-04-07 NOTE — Progress Notes (Signed)
Chief Complaint  Patient presents with  . Annual Exam   Patient is here for his annual examination. We discussed, again, his cigarette smoking and he is not contemplating quitting.  I did try to impress upon him that he is placing himself at increased risk of vascular disease, cancer, lung disease. Patient is under the care of a pain specialist for chronic back pain.  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  He continues to have pain on a daily basis.  He previously took Lyrica but it made him gain weight and have symptoms.  He is currently on gabapentin, Relafen, hydrocodone, Cymbalta, Flexeril, and  Belbuca. He has well-controlled hypertension.  He takes lisinopril hydrochlorothiazide daily. He also takes atorvastatin 40 mg daily for hyperlipidemia. Patient is overweight.  He has lost couple pounds since his last visit.  I discussed that it is important for him to try to lose weight to help with his chronic low back pain condition and his health in general.  He does not feel able to exercise because of his orthopedic problems.  We discussed reducing carbs, fats, portions, and snacking. He sees his eye doctor every year.  He is up-to-date with dental care. He turned 51 this year so we discussed colonoscopy.  He chooses cologuard testing with colonoscopy if positive.  Patient Active Problem List   Diagnosis Date Noted  . HLD (hyperlipidemia) 04/12/2016  . Essential hypertension 04/05/2016  . Family history of prostate cancer 04/05/2016  . Generalized anxiety disorder 04/05/2016  . GERD (gastroesophageal reflux disease) 04/05/2016  . Degenerative joint disease (DJD) of lumbar spine 04/05/2016  . Tobacco abuse 04/05/2016    Outpatient Encounter Medications as of 04/07/2017  Medication Sig  . aspirin 81 MG chewable tablet Chew 81 mg by mouth daily.  Marland Kitchen. atorvastatin (LIPITOR) 40 MG tablet Take 1 tablet (40 mg total) by mouth daily.  Marland Kitchen. BELBUCA 450 MCG FILM USE 1 FILM TWICE  DAILY - AS DIRECTED  . cyclobenzaprine (FLEXERIL) 10 MG tablet TAKE 1 TABLET 3 TIMES A DAY AS NEEDED  . DULoxetine (CYMBALTA) 30 MG capsule TAKE (1) CAPSULE BY MOUTH ONCE DAILY.  Marland Kitchen. gabapentin (NEURONTIN) 400 MG capsule Take 1 capsule (400 mg total) by mouth 3 (three) times daily.  Marland Kitchen. HYDROcodone-acetaminophen (NORCO/VICODIN) 5-325 MG tablet TAKE 1 TO 2 TABLETS BY MOUTH TWICE A DAY AS NEEDED  . lisinopril-hydrochlorothiazide (ZESTORETIC) 20-12.5 MG tablet Take 2 tablets by mouth daily.  . nabumetone (RELAFEN) 750 MG tablet TAKE (1) TABLET BY MOUTH TWICE DAILY.  . [DISCONTINUED] tiZANidine (ZANAFLEX) 4 MG tablet    No facility-administered encounter medications on file as of 04/07/2017.     Allergies  Allergen Reactions  . Erythromycin Palpitations    Review of Systems  Constitutional: Negative for activity change and appetite change.  HENT: Negative for congestion and dental problem.   Eyes: Negative for photophobia and visual disturbance.  Respiratory: Negative for cough and shortness of breath.   Cardiovascular: Negative for chest pain, palpitations and leg swelling.  Gastrointestinal: Negative for constipation and diarrhea.       No constipation from narcotics  Genitourinary: Negative for difficulty urinating and frequency.  Musculoskeletal: Positive for back pain and gait problem.       Chronic.  Some R leg weakness  Neurological: Negative for dizziness and headaches.  Psychiatric/Behavioral: Negative for dysphoric mood and sleep disturbance. The patient is not nervous/anxious.        Works nights    BP  138/88   Pulse 92   Temp 97.9 F (36.6 C) (Temporal)   Resp 18   Ht 6' (1.829 m)   Wt 261 lb (118.4 kg)   SpO2 99%   BMI 35.40 kg/m    Physical Exam   BP 138/88   Pulse 92   Temp 97.9 F (36.6 C) (Temporal)   Resp 18   Ht 6' (1.829 m)   Wt 261 lb (118.4 kg)   SpO2 99%   BMI 35.40 kg/m    General Appearance:    Alert, cooperative, no distress, appears stated  age.  Body piercings and multiple tattoos  Head:    Normocephalic, without obvious abnormality, atraumatic  Eyes:    PERRL, conjunctiva/corneas clear, EOM's intact, fundi    benign, both eyes       Ears:    Normal TM's and external ear canals, both ears  Nose:   Nares normal, septum midline, mucosa normal, no drainage   or sinus tenderness  Throat:   Lips, mucosa, and tongue normal; teeth and gums normal  Neck:   Supple, symmetrical, trachea midline, no adenopathy;       thyroid:  No enlargement/tenderness/nodules; no carotid   bruit  Back:     Symmetric, no curvature, ROM normal, no CVA tenderness.  Well-healed lumbar scar  Lungs:     Clear to auscultation bilaterally, respirations unlabored  Chest wall:    No tenderness or deformity  Heart:    Regular rate and rhythm, S1 and S2 normal, no murmur, rub   or gallop  Abdomen:     Soft, non-tender, bowel sounds active all four quadrants,    no masses, no organomegaly  Genitalia:    Normal male without lesion, discharge or tenderness  Rectal:    Normal tone, normal prostate, no masses or tenderness;   guaiac negative stool  Extremities:   Extremities normal, atraumatic, no cyanosis or edema.  Patellofemoral crepitus both knees no warmth or effusion  Pulses:   1+ and symmetric all extremities  Skin:   Skin color, texture, turgor normal, no rashes or lesions  Lymph nodes:   Cervical, supraclavicular, and axillary nodes normal  Neurologic:   Normal strength, sensation and reflexes      Throughout.  No focal neurologic findings     ASSESSMENT/PLAN:  1. Screen for colon cancer - Cologuard  2. Essential hypertension  3. PE (physical exam), annual   Patient Instructions  Walk every day that you are able See me in 6 months Call sooner for problems   Eustace Moore, MD

## 2017-04-07 NOTE — Patient Instructions (Signed)
Walk every day that you are able See me in 6 months Call sooner for problems

## 2017-04-23 ENCOUNTER — Other Ambulatory Visit: Payer: Self-pay | Admitting: Family Medicine

## 2017-04-23 MED ORDER — VARENICLINE TARTRATE 0.5 MG X 11 & 1 MG X 42 PO MISC: ORAL | 0 refills | Status: DC

## 2017-04-30 LAB — COLOGUARD

## 2017-07-07 ENCOUNTER — Telehealth: Payer: Self-pay | Admitting: Family Medicine

## 2017-07-07 NOTE — Telephone Encounter (Signed)
Patient called upset that you are leaving and he has not received any follow up on his colaguard test.  Can you call him with the results?  I did let him know you have moved him over to Dr. Tracie HarrierHagler.

## 2017-07-07 NOTE — Telephone Encounter (Signed)
Can you look up this result for me?  thanks

## 2017-07-09 NOTE — Telephone Encounter (Signed)
This result is negative

## 2017-07-09 NOTE — Telephone Encounter (Signed)
Left message asking patient to call back for test results

## 2017-07-09 NOTE — Telephone Encounter (Signed)
Please call and let him know the cologuard was negative.

## 2017-07-10 NOTE — Telephone Encounter (Signed)
Received vm on nurse line stating he had mychart. Mychart message sent with cologuard results sent via mychart message.

## 2017-07-10 NOTE — Telephone Encounter (Signed)
Left message asking patient to call back

## 2017-09-05 ENCOUNTER — Encounter: Payer: Self-pay | Admitting: Family Medicine

## 2017-10-07 ENCOUNTER — Ambulatory Visit: Payer: 59 | Admitting: Family Medicine

## 2019-01-08 ENCOUNTER — Other Ambulatory Visit: Payer: Self-pay

## 2019-01-08 DIAGNOSIS — Z20822 Contact with and (suspected) exposure to covid-19: Secondary | ICD-10-CM

## 2019-01-09 LAB — NOVEL CORONAVIRUS, NAA: SARS-CoV-2, NAA: NOT DETECTED

## 2019-05-10 DIAGNOSIS — Z6835 Body mass index (BMI) 35.0-35.9, adult: Secondary | ICD-10-CM | POA: Diagnosis not present

## 2019-05-10 DIAGNOSIS — E669 Obesity, unspecified: Secondary | ICD-10-CM | POA: Diagnosis not present

## 2019-05-10 DIAGNOSIS — N529 Male erectile dysfunction, unspecified: Secondary | ICD-10-CM | POA: Diagnosis not present

## 2019-05-10 DIAGNOSIS — E7849 Other hyperlipidemia: Secondary | ICD-10-CM | POA: Diagnosis not present

## 2019-05-10 DIAGNOSIS — I10 Essential (primary) hypertension: Secondary | ICD-10-CM | POA: Diagnosis not present

## 2019-05-10 DIAGNOSIS — E782 Mixed hyperlipidemia: Secondary | ICD-10-CM | POA: Diagnosis not present

## 2019-05-10 DIAGNOSIS — G894 Chronic pain syndrome: Secondary | ICD-10-CM | POA: Diagnosis not present

## 2019-05-10 DIAGNOSIS — Z0001 Encounter for general adult medical examination with abnormal findings: Secondary | ICD-10-CM | POA: Diagnosis not present

## 2019-05-10 DIAGNOSIS — Z23 Encounter for immunization: Secondary | ICD-10-CM | POA: Diagnosis not present

## 2019-05-10 DIAGNOSIS — Z1389 Encounter for screening for other disorder: Secondary | ICD-10-CM | POA: Diagnosis not present

## 2019-07-22 DIAGNOSIS — I10 Essential (primary) hypertension: Secondary | ICD-10-CM | POA: Diagnosis not present

## 2019-07-22 DIAGNOSIS — Z23 Encounter for immunization: Secondary | ICD-10-CM | POA: Diagnosis not present

## 2019-07-22 DIAGNOSIS — L739 Follicular disorder, unspecified: Secondary | ICD-10-CM | POA: Diagnosis not present

## 2019-07-22 DIAGNOSIS — E6609 Other obesity due to excess calories: Secondary | ICD-10-CM | POA: Diagnosis not present

## 2019-07-22 DIAGNOSIS — E7849 Other hyperlipidemia: Secondary | ICD-10-CM | POA: Diagnosis not present

## 2019-07-22 DIAGNOSIS — R002 Palpitations: Secondary | ICD-10-CM | POA: Diagnosis not present

## 2019-07-22 DIAGNOSIS — Z719 Counseling, unspecified: Secondary | ICD-10-CM | POA: Diagnosis not present

## 2019-07-22 DIAGNOSIS — Z6835 Body mass index (BMI) 35.0-35.9, adult: Secondary | ICD-10-CM | POA: Diagnosis not present

## 2019-07-22 DIAGNOSIS — F17290 Nicotine dependence, other tobacco product, uncomplicated: Secondary | ICD-10-CM | POA: Diagnosis not present

## 2019-08-05 DIAGNOSIS — R1084 Generalized abdominal pain: Secondary | ICD-10-CM | POA: Diagnosis not present

## 2019-08-05 DIAGNOSIS — Z23 Encounter for immunization: Secondary | ICD-10-CM | POA: Diagnosis not present

## 2019-08-05 DIAGNOSIS — R109 Unspecified abdominal pain: Secondary | ICD-10-CM | POA: Diagnosis not present

## 2019-08-27 ENCOUNTER — Ambulatory Visit: Payer: 59 | Admitting: Cardiovascular Disease

## 2019-09-22 DIAGNOSIS — M1711 Unilateral primary osteoarthritis, right knee: Secondary | ICD-10-CM | POA: Diagnosis not present

## 2019-10-06 DIAGNOSIS — Z6836 Body mass index (BMI) 36.0-36.9, adult: Secondary | ICD-10-CM | POA: Diagnosis not present

## 2019-10-06 DIAGNOSIS — M1991 Primary osteoarthritis, unspecified site: Secondary | ICD-10-CM | POA: Diagnosis not present

## 2019-10-06 DIAGNOSIS — I10 Essential (primary) hypertension: Secondary | ICD-10-CM | POA: Diagnosis not present

## 2019-10-06 DIAGNOSIS — E6609 Other obesity due to excess calories: Secondary | ICD-10-CM | POA: Diagnosis not present

## 2019-10-15 ENCOUNTER — Encounter: Payer: Self-pay | Admitting: Cardiovascular Disease

## 2019-10-15 ENCOUNTER — Ambulatory Visit: Payer: BC Managed Care – PPO | Admitting: Cardiovascular Disease

## 2019-10-15 ENCOUNTER — Other Ambulatory Visit: Payer: Self-pay

## 2019-10-15 VITALS — BP 126/68 | HR 78 | Ht 72.0 in | Wt 255.4 lb

## 2019-10-15 DIAGNOSIS — E7800 Pure hypercholesterolemia, unspecified: Secondary | ICD-10-CM

## 2019-10-15 DIAGNOSIS — I10 Essential (primary) hypertension: Secondary | ICD-10-CM | POA: Diagnosis not present

## 2019-10-15 DIAGNOSIS — E668 Other obesity: Secondary | ICD-10-CM

## 2019-10-15 DIAGNOSIS — Z87891 Personal history of nicotine dependence: Secondary | ICD-10-CM

## 2019-10-15 DIAGNOSIS — I493 Ventricular premature depolarization: Secondary | ICD-10-CM

## 2019-10-15 NOTE — Progress Notes (Signed)
Cardiology Office Note (new patient):    Date:  10/17/2019   ID:  Terrence Robinson, DOB November 06, 1965, MRN [id number]  PCP:  Elfredia Nevins, MD  [facility] Cardiologist:  Thurmon Fair, MD  [facility] Electrophysiologist:  None   Referring MD: Elfredia Nevins, MD   Chief Complaint  Patient presents with  . Palpitations    History of Present Illness:    Terrence Robinson is a 54 y.o. male with a hx of essential hypertension, previous smoker, hypercholesterolemia, who requested an appointment due to palpitations.  He has not seen a cardiologist before, but his father [name] has been my patient for several years.  His palpitations occurred earlier in his year when his son had torn his Achilles tendon and he was very worried about his ability to pay for his surgery.  His palpitations were frequent but isolated, not sustained.  They were not associated with dizziness or syncope or other cardiovascular complaints.  He was smoking a lot at the time.  He stopped smoking completely on April 30 and has noticed that since then his palpitations have been dramatically better.  It helped that his financial situation improved as well.  An ECG performed during symptoms on July 22, 2019 shows sinus rhythm with frequent monomorphic PVCs but is otherwise a normal tracing.  His electrocardiogram today shows normal sinus rhythm with sinus arrhythmia.  The QTC is normal at 414 ms.  He has well-controlled hypertension and takes a statin for hyperlipidemia.  He does not have any history of CAD or PAD.  The patient specifically denies any chest pain at rest exertion, dyspnea at rest or with exertion, orthopnea, paroxysmal nocturnal dyspnea, syncope, focal neurological deficits, intermittent claudication, lower extremity edema, unexplained weight gain, cough, hemoptysis or wheezing.    Past Medical History:  Diagnosis Date  . Degenerative joint disease (DJD) of lumbar spine    back DJD  . Essential  hypertension   . Generalized anxiety disorder   . GERD (gastroesophageal reflux disease)   . HLD (hyperlipidemia) 04/12/2016  . Hypertension   . Neuromuscular disorder (HCC)    sciatica  . Pneumonia   . Tobacco abuse 04/05/2016    Past Surgical History:  Procedure Laterality Date  . AMPUTATION  2003   finger left hand  . BACK SURGERY     x 3  . CARPAL TUNNEL RELEASE Right   . SPINE SURGERY     2001, 2010, 2012    Current Medications: Current Meds  Medication Sig  . atorvastatin (LIPITOR) 40 MG tablet Take 1 tablet (40 mg total) by mouth daily.  Marland Kitchen gabapentin (NEURONTIN) 400 MG capsule Take 1 capsule (400 mg total) by mouth 3 (three) times daily.  Marland Kitchen lisinopril-hydrochlorothiazide (ZESTORETIC) 20-12.5 MG tablet Take 2 tablets by mouth daily.  . metoprolol succinate (TOPROL-XL) 25 MG 24 hr tablet Take by mouth.  . varenicline (CHANTIX STARTING MONTH PAK) 0.5 MG X 11 & 1 MG X 42 tablet Take one 0.5 mg tablet by mouth once daily for 3 days, then increase to one 0.5 mg tablet twice daily for 4 days, then increase to one 1 mg tablet twice daily.  . [DISCONTINUED] aspirin 81 MG chewable tablet Chew 81 mg by mouth daily.  . [DISCONTINUED] BELBUCA 450 MCG FILM USE 1 FILM TWICE DAILY - AS DIRECTED  . [DISCONTINUED] cyclobenzaprine (FLEXERIL) 10 MG tablet TAKE 1 TABLET 3 TIMES A DAY AS NEEDED  . [DISCONTINUED] DULoxetine (CYMBALTA) 30 MG capsule TAKE (1) CAPSULE BY  MOUTH ONCE DAILY.  . [DISCONTINUED] HYDROcodone-acetaminophen (NORCO/VICODIN) 5-325 MG tablet TAKE 1 TO 2 TABLETS BY MOUTH TWICE A DAY AS NEEDED  . [DISCONTINUED] nabumetone (RELAFEN) 750 MG tablet TAKE (1) TABLET BY MOUTH TWICE DAILY.     Allergies:   Erythromycin   Social History   Socioeconomic History  . Marital status: Married    Spouse name: wendy  . Number of children: 2  . Years of education: 55  . Highest education level: Not on file  Occupational History  . Occupation: Set designer  Tobacco Use  . Smoking  status: Current Every Day Smoker    Packs/day: 1.00    Types: Cigarettes    Start date: 04/01/1986  . Smokeless tobacco: Never Used  Substance and Sexual Activity  . Alcohol use: No  . Drug use: No  . Sexual activity: Yes    Birth control/protection: Post-menopausal  Other Topics Concern  . Not on file  Social History Narrative   Lives with wife Toniann Fail    one son-  Terrence Robinson in college   Maintenance at [employer]    Social Determinants of Health   Financial Resource Strain:   . Difficulty of Paying Living Expenses:   Food Insecurity:   . Worried About Programme researcher, broadcasting/film/video in the Last Year:   . Barista in the Last Year:   Transportation Needs:   . Freight forwarder (Medical):   Marland Kitchen Lack of Transportation (Non-Medical):   Physical Activity:   . Days of Exercise per Week:   . Minutes of Exercise per Session:   Stress:   . Feeling of Stress :   Social Connections:   . Frequency of Communication with Friends and Family:   . Frequency of Social Gatherings with Friends and Family:   . Attends Religious Services:   . Active Member of Clubs or Organizations:   . Attends Banker Meetings:   Marland Kitchen Marital Status:      Family History: The patient's family history includes Arthritis in his father; Cancer in his father and paternal grandfather; Diabetes in his paternal grandmother; Heart disease in his father; Hyperlipidemia in his brother; Hypertension in his father; Kidney disease in his father.  ROS:   Please see the history of present illness.    All other systems reviewed and are negative.  EKGs/Labs/Other Studies Reviewed:    The following studies were reviewed today: ECG from 07/22/2019  EKG:  EKG is  ordered today.  The ekg ordered today demonstrates sinus rhythm with sinus arrhythmia, normal tracing  Recent Labs: No results found for requested labs within last 8760 hours.  05/10/2019 Hemoglobin 15.1, creatinine 0.79, potassium 4.4, normal liver  function tests, TSH 0.795 11/10/2018 Hemoglobin A1c 5.8% Recent Lipid Panel    Component Value Date/Time   CHOL 124 04/02/2017 1032   TRIG 83 04/02/2017 1032   HDL 46 04/02/2017 1032   CHOLHDL 2.7 04/02/2017 1032   VLDL 18 07/10/2016 0925   LDLCALC 61 04/02/2017 1032  05/10/2019 Total cholesterol 107, HDL 43, LDL 50, triglycerides 61  Physical Exam:    VS:  BP 126/68   Pulse 78   Ht 6' (1.829 m)   Wt 255 lb 6.4 oz (115.8 kg)   SpO2 98%   BMI 34.64 kg/m     Wt Readings from Last 3 Encounters:  10/15/19 255 lb 6.4 oz (115.8 kg)  04/07/17 261 lb (118.4 kg)  12/31/16 265 lb 1.9 oz (120.3 kg)  GEN: Moderately obese, well nourished, well developed in no acute distress HEENT: Normal NECK: No JVD; No carotid bruits LYMPHATICS: No lymphadenopathy CARDIAC: RRR, no murmurs, rubs, gallops RESPIRATORY:  Clear to auscultation without rales, wheezing or rhonchi  ABDOMEN: Soft, non-tender, non-distended MUSCULOSKELETAL:  No edema; No deformity  SKIN: Warm and dry NEUROLOGIC:  Alert and oriented x 3 PSYCHIATRIC:  Normal affect   ASSESSMENT:    1. PVC's (premature ventricular contractions)   2. Essential hypertension   3. Hypercholesterolemia   4. Moderate obesity   5. History of cigarette smoking    PLAN:    In order of problems listed above:  1. PVCs: Related to hyperadrenergic state and currently resolved.  I suggested that for future monitoring he may want to have a personal electronic monitoring device such as a Kardia or Apple watch.  At this point additional investigation does not appear to be warranted since he is completely asymptomatic. 2. HTN: Well-controlled on current medications.  If palpitations become a problem again can switch his ACE inhibitor to a beta-blocker. 3. HLP: All his lipid parameters are well within target range on the current medication.  Continue atorvastatin. 4. Obesity: Encouraged weight loss.  He does not have any symptoms of daytime  hypersomnolence. 5. History of smoking, 40 pack years:.  Congratulated him on quitting.   Medication Adjustments/Labs and Tests Ordered: Current medicines are reviewed at length with the patient today.  Concerns regarding medicines are outlined above.  Orders Placed This Encounter  Procedures  . EKG 12-Lead   No orders of the defined types were placed in this encounter.   Patient Instructions  Medication Instructions:  No changes *If you need a refill on your cardiac medications before your next appointment, please call your pharmacy*   Lab Work: None ordered If you have labs (blood work) drawn today and your tests are completely normal, you will receive your results only by: Marland Kitchen MyChart Message (if you have MyChart) OR . A paper copy in the mail If you have any lab test that is abnormal or we need to change your treatment, we will call you to review the results.   Testing/Procedures: None ordered   Follow-Up: At [facility], you and your health needs are our priority.  As part of our continuing mission to provide you with exceptional heart care, we have created designated Provider Care Teams.  These Care Teams include your primary Cardiologist (physician) and Advanced Practice Providers (APPs -  Physician Assistants and Nurse Practitioners) who all work together to provide you with the care you need, when you need it.  We recommend signing up for the patient portal called "MyChart".  Sign up information is provided on this After Visit Summary.  MyChart is used to connect with patients for Virtual Visits (Telemedicine).  Patients are able to view lab/test results, encounter notes, upcoming appointments, etc.  Non-urgent messages can be sent to your provider as well.   To learn more about what you can do with MyChart, go to [email or web address].    Your next appointment:   12 month(s)  The format for your next appointment:   In Person  Provider:   Thurmon Fair,  MD       Signed, Thurmon Fair, MD  10/17/2019 4:47 PM    [facility]

## 2019-10-15 NOTE — Patient Instructions (Signed)

## 2019-10-17 ENCOUNTER — Encounter: Payer: Self-pay | Admitting: Cardiovascular Disease

## 2020-03-27 DIAGNOSIS — M13842 Other specified arthritis, left hand: Secondary | ICD-10-CM | POA: Diagnosis not present

## 2020-03-27 DIAGNOSIS — M65842 Other synovitis and tenosynovitis, left hand: Secondary | ICD-10-CM | POA: Diagnosis not present

## 2020-05-25 DIAGNOSIS — N529 Male erectile dysfunction, unspecified: Secondary | ICD-10-CM | POA: Diagnosis not present

## 2020-05-25 DIAGNOSIS — Z6838 Body mass index (BMI) 38.0-38.9, adult: Secondary | ICD-10-CM | POA: Diagnosis not present

## 2020-05-25 DIAGNOSIS — R739 Hyperglycemia, unspecified: Secondary | ICD-10-CM | POA: Diagnosis not present

## 2020-05-25 DIAGNOSIS — E669 Obesity, unspecified: Secondary | ICD-10-CM | POA: Diagnosis not present

## 2020-05-25 DIAGNOSIS — E7489 Other specified disorders of carbohydrate metabolism: Secondary | ICD-10-CM | POA: Diagnosis not present

## 2020-05-25 DIAGNOSIS — G894 Chronic pain syndrome: Secondary | ICD-10-CM | POA: Diagnosis not present

## 2020-05-25 DIAGNOSIS — I10 Essential (primary) hypertension: Secondary | ICD-10-CM | POA: Diagnosis not present

## 2020-05-25 DIAGNOSIS — E782 Mixed hyperlipidemia: Secondary | ICD-10-CM | POA: Diagnosis not present

## 2020-05-25 DIAGNOSIS — M1991 Primary osteoarthritis, unspecified site: Secondary | ICD-10-CM | POA: Diagnosis not present

## 2020-05-25 DIAGNOSIS — E7849 Other hyperlipidemia: Secondary | ICD-10-CM | POA: Diagnosis not present

## 2020-05-25 DIAGNOSIS — Z0001 Encounter for general adult medical examination with abnormal findings: Secondary | ICD-10-CM | POA: Diagnosis not present

## 2020-06-09 DIAGNOSIS — Z1211 Encounter for screening for malignant neoplasm of colon: Secondary | ICD-10-CM | POA: Diagnosis not present

## 2020-06-09 DIAGNOSIS — Z1212 Encounter for screening for malignant neoplasm of rectum: Secondary | ICD-10-CM | POA: Diagnosis not present

## 2020-06-15 LAB — COLOGUARD: COLOGUARD: NEGATIVE

## 2020-08-02 DIAGNOSIS — M170 Bilateral primary osteoarthritis of knee: Secondary | ICD-10-CM | POA: Diagnosis not present

## 2020-08-09 DIAGNOSIS — J019 Acute sinusitis, unspecified: Secondary | ICD-10-CM | POA: Diagnosis not present

## 2020-08-09 DIAGNOSIS — Z6837 Body mass index (BMI) 37.0-37.9, adult: Secondary | ICD-10-CM | POA: Diagnosis not present

## 2020-08-09 DIAGNOSIS — J301 Allergic rhinitis due to pollen: Secondary | ICD-10-CM | POA: Diagnosis not present

## 2020-08-18 DIAGNOSIS — M7671 Peroneal tendinitis, right leg: Secondary | ICD-10-CM | POA: Diagnosis not present

## 2020-08-18 DIAGNOSIS — M79671 Pain in right foot: Secondary | ICD-10-CM | POA: Diagnosis not present

## 2020-09-06 DIAGNOSIS — M170 Bilateral primary osteoarthritis of knee: Secondary | ICD-10-CM | POA: Diagnosis not present

## 2020-10-20 ENCOUNTER — Ambulatory Visit: Payer: BC Managed Care – PPO | Admitting: Cardiovascular Disease

## 2020-10-20 ENCOUNTER — Encounter: Payer: Self-pay | Admitting: Cardiovascular Disease

## 2020-10-20 ENCOUNTER — Other Ambulatory Visit: Payer: Self-pay

## 2020-10-20 VITALS — BP 140/76 | HR 82 | Ht 72.0 in | Wt 270.4 lb

## 2020-10-20 DIAGNOSIS — I493 Ventricular premature depolarization: Secondary | ICD-10-CM

## 2020-10-20 DIAGNOSIS — E785 Hyperlipidemia, unspecified: Secondary | ICD-10-CM | POA: Diagnosis not present

## 2020-10-20 DIAGNOSIS — Z87891 Personal history of nicotine dependence: Secondary | ICD-10-CM

## 2020-10-20 DIAGNOSIS — I10 Essential (primary) hypertension: Secondary | ICD-10-CM | POA: Diagnosis not present

## 2020-10-20 NOTE — Patient Instructions (Signed)
Medication Instructions:  No changes *If you need a refill on your cardiac medications before your next appointment, please call your pharmacy*   Lab Work: None ordered If you have labs (blood work) drawn today and your tests are completely normal, you will receive your results only by: . MyChart Message (if you have MyChart) OR . A paper copy in the mail If you have any lab test that is abnormal or we need to change your treatment, we will call you to review the results.   Testing/Procedures: None ordered   Follow-Up: At [facility], you and your health needs are our priority.  As part of our continuing mission to provide you with exceptional heart care, we have created designated Provider Care Teams.  These Care Teams include your primary Cardiologist (physician) and Advanced Practice Providers (APPs -  Physician Assistants and Nurse Practitioners) who all work together to provide you with the care you need, when you need it.  We recommend signing up for the patient portal called "MyChart".  Sign up information is provided on this After Visit Summary.  MyChart is used to connect with patients for Virtual Visits (Telemedicine).  Patients are able to view lab/test results, encounter notes, upcoming appointments, etc.  Non-urgent messages can be sent to your provider as well.   To learn more about what you can do with MyChart, go to [email or web address].    Your next appointment:   As needed with Dr. Croitoru 

## 2020-10-20 NOTE — Progress Notes (Signed)
Cardiology Office Note (new patient):    Date:  10/21/2020   ID:  Terrence Robinson, DOB [birth year], MRN [id number]  PCP:  Elfredia Nevins, MD  [facility] Cardiologist:  Thurmon Fair, MD  [facility] Electrophysiologist:  None   Referring MD: Elfredia Nevins, MD   Chief Complaint  Patient presents with   Irregular Heart Beat     History of Present Illness:    Terrence Robinson is a 55 y.o. male with a hx of essential hypertension, previous smoker, hypercholesterolemia, who was initially seen due to palpitations associated with PVCs.  Once he had a lower level of emotional stress, his palpitations have resolved completely.  He has quit smoking successfully for over a year now and unfortunately has gained about 20 pounds.  He is now unaware of palpitations, although 3 PVCs were seen on his 10-second ECG strip and ectopy was auscultated relatively frequently during his exam today.  He complains of fatigue, but interpretation of the symptoms is difficult since he works the night shift, 3 x 12-hour shifts a week, sometimes extra on the weekends.  He appears to have appropriate fatigue depending on how much she has recently worked and how much sleep he was able to get.  He actually scores a 0 on the Epworth Sleepiness Scale.  His wife has told him that he snores, but has never described him as having apneic events.   Past Medical History:  Diagnosis Date   Degenerative joint disease (DJD) of lumbar spine    back DJD   Essential hypertension    Generalized anxiety disorder    GERD (gastroesophageal reflux disease)    HLD (hyperlipidemia) 04/12/2016   Hypertension    Neuromuscular disorder (HCC)    sciatica   Pneumonia    Tobacco abuse 04/05/2016    Past Surgical History:  Procedure Laterality Date   AMPUTATION  2003   finger left hand   BACK SURGERY     x 3   CARPAL TUNNEL RELEASE Right    SPINE SURGERY     2001, 2010, 2012    Current Medications: Current Meds   Medication Sig   atorvastatin (LIPITOR) 40 MG tablet Take 1 tablet (40 mg total) by mouth daily.   lisinopril-hydrochlorothiazide (ZESTORETIC) 20-12.5 MG tablet Take 2 tablets by mouth daily.   metoprolol succinate (TOPROL-XL) 25 MG 24 hr tablet Take 25 mg by mouth daily.     Allergies:   Erythromycin   Social History   Socioeconomic History   Marital status: Married    Spouse name: wendy   Number of children: 2   Years of education: 12   Highest education level: Not on file  Occupational History   Occupation: Set designer  Tobacco Use   Smoking status: Former    Packs/day: 1.00    Types: Cigarettes    Start date: 04/01/1986    Quit date: 07/30/2019    Years since quitting: 1.2   Smokeless tobacco: Never  Substance and Sexual Activity   Alcohol use: No   Drug use: No   Sexual activity: Yes    Birth control/protection: Post-menopausal  Other Topics Concern   Not on file  Social History Narrative   Lives with wife Toniann Fail    one son-  Fransisco Beau in college   Maintenance at [employer]    Social Determinants of Health   Financial Resource Strain: Not on file  Food Insecurity: Not on file  Transportation Needs: Not on file  Physical Activity: Not  on file  Stress: Not on file  Social Connections: Not on file     Family History: The patient's family history includes Arthritis in his father; Cancer in his father and paternal grandfather; Diabetes in his paternal grandmother; Heart disease in his father; Hyperlipidemia in his brother; Hypertension in his father; Kidney disease in his father.  ROS:   Please see the history of present illness.    All other systems reviewed and are negative.  EKGs/Labs/Other Studies Reviewed:    The following studies were reviewed today: ECG from 07/22/2019  EKG:  EKG is  ordered today.  Personally reviewed, it shows sinus rhythm with frequent PVCs/fusion beats.  It is otherwise a completely normal tracing.  No repolarization abnormalities  and QTc 429 ms.  Recent Labs: No results found for requested labs within last 8760 hours.  05/10/2019 Hemoglobin 15.1, creatinine 0.79, potassium 4.4, normal liver function tests, TSH 0.795 11/10/2018 Hemoglobin A1c 5.8% 05/25/2020 Creatinine 0.69, potassium 4.4, hemoglobin 15.1 ALT 52, TSH 1.99, Hemoglobin A1c 6.0%  Recent Lipid Panel    Component Value Date/Time   CHOL 124 04/02/2017 1032   TRIG 83 04/02/2017 1032   HDL 46 04/02/2017 1032   CHOLHDL 2.7 04/02/2017 1032   VLDL 18 07/10/2016 0925   LDLCALC 61 04/02/2017 1032  05/10/2019 Total cholesterol 107, HDL 43, LDL 50, triglycerides 61 05/25/2020 Cholesterol 124, HDL 45, LDL 63, triglycerides 80  Physical Exam:    VS:  BP 140/76 (BP Location: Left Arm, Patient Position: Sitting, Cuff Size: Large)   Pulse 82   Ht 6' (1.829 m)   Wt 270 lb 6.4 oz (122.7 kg)   SpO2 98%   BMI 36.67 kg/m     Wt Readings from Last 3 Encounters:  10/20/20 270 lb 6.4 oz (122.7 kg)  10/15/19 255 lb 6.4 oz (115.8 kg)  04/07/17 261 lb (118.4 kg)     General: Alert, oriented x3, no distress, severely obese. Head: no evidence of trauma, PERRL, EOMI, no exophtalmos or lid lag, no myxedema, no xanthelasma; normal ears, nose and oropharynx Neck: normal jugular venous pulsations and no hepatojugular reflux; brisk carotid pulses without delay and no carotid bruits Chest: clear to auscultation, no signs of consolidation by percussion or palpation, normal fremitus, symmetrical and full respiratory excursions Cardiovascular: normal position and quality of the apical impulse, regular rhythm baseline with frequent ectopy, normal first and second heart sounds, no murmurs, rubs or gallops Abdomen: no tenderness or distention, no masses by palpation, no abnormal pulsatility or arterial bruits, normal bowel sounds, no hepatosplenomegaly Extremities: no clubbing, cyanosis or edema; 2+ radial, ulnar and brachial pulses bilaterally; 2+ right femoral, posterior  tibial and dorsalis pedis pulses; 2+ left femoral, posterior tibial and dorsalis pedis pulses; no subclavian or femoral bruits Neurological: grossly nonfocal Psych: Normal mood and affect   ASSESSMENT:    1. PVC's (premature ventricular contractions)   2. Essential hypertension   3. Dyslipidemia (high LDL; low HDL)   4. Severe obesity (BMI 35.0-39.9) with comorbidity (HCC)   5. History of cigarette smoking     PLAN:    In order of problems listed above:  PVCs: He is currently not troubled by these, although they are still occurring.  In the absence of structural heart disease, syncope or near syncope or other high risk features, no evaluation is needed.  He prefers not to undergo further testing. HTN: Usually well controlled.  Borderline high today, no changes made to his medications. HLP: Overall good lipid parameters  on rosuvastatin which she should continue.  The HDL is relatively low and should improve with weight loss. Obesity: Apnea.  Unfortunately he gained a lot of weight when he quit smoking, but the overall balance is still beneficial.  He does not have symptoms of obstructive History of smoking, 40 pack years:.  No wheezing or other symptoms of COPD to date.   Medication Adjustments/Labs and Tests Ordered: Current medicines are reviewed at length with the patient today.  Concerns regarding medicines are outlined above.  Orders Placed This Encounter  Procedures   EKG 12-Lead    No orders of the defined types were placed in this encounter.   Patient Instructions  Medication Instructions:  No changes *If you need a refill on your cardiac medications before your next appointment, please call your pharmacy*   Lab Work: None ordered If you have labs (blood work) drawn today and your tests are completely normal, you will receive your results only by: MyChart Message (if you have MyChart) OR A paper copy in the mail If you have any lab test that is abnormal or we need  to change your treatment, we will call you to review the results.   Testing/Procedures: None ordered   Follow-Up: At [facility], you and your health needs are our priority.  As part of our continuing mission to provide you with exceptional heart care, we have created designated Provider Care Teams.  These Care Teams include your primary Cardiologist (physician) and Advanced Practice Providers (APPs -  Physician Assistants and Nurse Practitioners) who all work together to provide you with the care you need, when you need it.  We recommend signing up for the patient portal called "MyChart".  Sign up information is provided on this After Visit Summary.  MyChart is used to connect with patients for Virtual Visits (Telemedicine).  Patients are able to view lab/test results, encounter notes, upcoming appointments, etc.  Non-urgent messages can be sent to your provider as well.   To learn more about what you can do with MyChart, go to [email or web address].    Your next appointment:   As needed with Dr. Royann Shivers   Signed, Thurmon Fair, MD  10/21/2020 11:52 AM    [facility]

## 2020-10-21 ENCOUNTER — Encounter: Payer: Self-pay | Admitting: Cardiovascular Disease

## 2021-02-07 DIAGNOSIS — M1711 Unilateral primary osteoarthritis, right knee: Secondary | ICD-10-CM | POA: Diagnosis not present

## 2021-02-14 DIAGNOSIS — Z6837 Body mass index (BMI) 37.0-37.9, adult: Secondary | ICD-10-CM | POA: Diagnosis not present

## 2021-02-14 DIAGNOSIS — K219 Gastro-esophageal reflux disease without esophagitis: Secondary | ICD-10-CM | POA: Diagnosis not present

## 2021-02-14 DIAGNOSIS — E6609 Other obesity due to excess calories: Secondary | ICD-10-CM | POA: Diagnosis not present

## 2021-02-27 ENCOUNTER — Encounter: Payer: Self-pay | Admitting: Cardiovascular Disease

## 2021-03-02 ENCOUNTER — Ambulatory Visit (INDEPENDENT_AMBULATORY_CARE_PROVIDER_SITE_OTHER): Payer: BC Managed Care – PPO | Admitting: Cardiovascular Disease

## 2021-03-02 ENCOUNTER — Other Ambulatory Visit: Payer: Self-pay

## 2021-03-02 ENCOUNTER — Encounter: Payer: Self-pay | Admitting: Cardiovascular Disease

## 2021-03-02 VITALS — BP 150/84 | HR 53 | Ht 72.0 in | Wt 281.0 lb

## 2021-03-02 DIAGNOSIS — R400 Somnolence: Secondary | ICD-10-CM

## 2021-03-02 DIAGNOSIS — I493 Ventricular premature depolarization: Secondary | ICD-10-CM

## 2021-03-02 DIAGNOSIS — Z87891 Personal history of nicotine dependence: Secondary | ICD-10-CM

## 2021-03-02 DIAGNOSIS — R0602 Shortness of breath: Secondary | ICD-10-CM | POA: Diagnosis not present

## 2021-03-02 DIAGNOSIS — I10 Essential (primary) hypertension: Secondary | ICD-10-CM | POA: Diagnosis not present

## 2021-03-02 DIAGNOSIS — E7800 Pure hypercholesterolemia, unspecified: Secondary | ICD-10-CM

## 2021-03-02 MED ORDER — METOPROLOL SUCCINATE ER 50 MG PO TB24: 50.0000 mg | ORAL_TABLET | Freq: Every day | ORAL | 3 refills | Status: DC

## 2021-03-02 NOTE — Addendum Note (Signed)
Addended by: Thurmon Fair on: 03/02/2021 11:27 AM   Modules accepted: Orders

## 2021-03-02 NOTE — Patient Instructions (Signed)
Medication Instructions:  No changes *If you need a refill on your cardiac medications before your next appointment, please call your pharmacy*   Lab Work: None ordered If you have labs (blood work) drawn today and your tests are completely normal, you will receive your results only by: MyChart Message (if you have MyChart) OR A paper copy in the mail If you have any lab test that is abnormal or we need to change your treatment, we will call you to review the results.   Testing/Procedures: Your physician has requested that you have an echocardiogram. Echocardiography is a painless test that uses sound waves to create images of your heart. It provides your doctor with information about the size and shape of your heart and how well your heart's chambers and valves are working. You may receive an ultrasound enhancing agent through an IV if needed to better visualize your heart during the echo.This procedure takes approximately one hour. There are no restrictions for this procedure. This will take place at the [address].   Your physician has recommended that you have a sleep study. This test records several body functions during sleep, including: brain activity, eye movement, oxygen and carbon dioxide blood levels, heart rate and rhythm, breathing rate and rhythm, the flow of air through your mouth and nose, snoring, body muscle movements, and chest and belly movement.  Follow-Up: At [facility], you and your health needs are our priority.  As part of our continuing mission to provide you with exceptional heart care, we have created designated Provider Care Teams.  These Care Teams include your primary Cardiologist (physician) and Advanced Practice Providers (APPs -  Physician Assistants and Nurse Practitioners) who all work together to provide you with the care you need, when you need it.  We recommend signing up for the patient portal called "MyChart".  Sign up information is  provided on this After Visit Summary.  MyChart is used to connect with patients for Virtual Visits (Telemedicine).  Patients are able to view lab/test results, encounter notes, upcoming appointments, etc.  Non-urgent messages can be sent to your provider as well.   To learn more about what you can do with MyChart, go to [email or web address].    Your next appointment:   4 month(s)  The format for your next appointment:   In Person  Provider:   Thurmon Fair, MD

## 2021-03-02 NOTE — Progress Notes (Signed)
Cardiology Office Note (new patient):    Date:  03/02/2021   ID:  Terrence Robinson, DOB [birth year], MRN [id number]  PCP:  Elfredia Nevins, MD  [facility] Cardiologist:  Thurmon Fair, MD  [facility] Electrophysiologist:  None   Referring MD: Elfredia Nevins, MD   No chief complaint on file.    History of Present Illness:    Terrence Robinson is a 55 y.o. male with a hx of essential hypertension, previous smoker, hypercholesterolemia, who was initially seen due to palpitations associated with PVCs.  He has recently had problems with chest tightness.  This occurs at rest, usually when he is under some emotional stress, but does not occur when he is doing physical activity, even intense physical work.  He complains of poor sleep quality, fatigue all day long, memory issues.  He is a loud snorer and his wife has noticed sleep apnea episodes.  After quitting smoking he has gained about 30 pounds.  He has gained about 60 pounds in the last 4 years.  He has always blamed his sleep issues on the fact that he works 3 x 12-hour night shifts a week, sometimes extra on the weekends and switching from day to night schedule is always disturbing.  Today he scored 7 points on the Epworth Sleepiness Scale.  He denies wheezing or chronic cough.  He does not have lower extremity edema or focal neurological complaints.  He denies orthopnea or PND.  He has noticed exertional dyspnea but he still able to put in intense physical activity, just not as easily as in the past.  A Kardiamobile transmission that he submitted for chest pressure shows extremely frequent monomorphic PVCs.  On his ECG today he also has frequent monomorphic PVCs.  These may have LVOT origin (right bundle branch block morphology in lead V1, monophasic in the limb leads with inferior axis, cannot tell where the transition zone is in the precordial leads on this tracing, but seemed to be in lead V2 on a previous tracing).    Past Medical  History:  Diagnosis Date   Degenerative joint disease (DJD) of lumbar spine    back DJD   Essential hypertension    Generalized anxiety disorder    GERD (gastroesophageal reflux disease)    HLD (hyperlipidemia) 04/12/2016   Hypertension    Neuromuscular disorder (HCC)    sciatica   Pneumonia    Tobacco abuse 04/05/2016    Past Surgical History:  Procedure Laterality Date   AMPUTATION  2003   finger left hand   BACK SURGERY     x 3   CARPAL TUNNEL RELEASE Right    SPINE SURGERY     2001, 2010, 2012    Current Medications: No outpatient medications have been marked as taking for the 03/02/21 encounter (Office Visit) with Thurmon Fair, MD.     Allergies:   Erythromycin   Social History   Socioeconomic History   Marital status: Married    Spouse name: wendy   Number of children: 2   Years of education: 12   Highest education level: Not on file  Occupational History   Occupation: Set designer  Tobacco Use   Smoking status: Former    Packs/day: 1.00    Types: Cigarettes    Start date: 04/01/1986    Quit date: 07/30/2019    Years since quitting: 1.5   Smokeless tobacco: Never  Substance and Sexual Activity   Alcohol use: No   Drug use: No  Sexual activity: Yes    Birth control/protection: Post-menopausal  Other Topics Concern   Not on file  Social History Narrative   Lives with wife Toniann Fail    one son-  Fransisco Beau in college   Maintenance at [employer]    Social Determinants of Health   Financial Resource Strain: Not on file  Food Insecurity: Not on file  Transportation Needs: Not on file  Physical Activity: Not on file  Stress: Not on file  Social Connections: Not on file     Family History: The patient's family history includes Arthritis in his father; Cancer in his father and paternal grandfather; Diabetes in his paternal grandmother; Heart disease in his father; Hyperlipidemia in his brother; Hypertension in his father; Kidney disease in his  father.  ROS:   Please see the history of present illness.    All other systems reviewed and are negative.  EKGs/Labs/Other Studies Reviewed:     EKG:  EKG is  ordered today.  Personally reviewed shows sinus rhythm with frequent PVCs and fusion beats, but is otherwise a normal tracing.  QTc is normal at 450 ms.  Recent Labs: No results found for requested labs within last 8760 hours.  05/10/2019 Hemoglobin 15.1, creatinine 0.79, potassium 4.4, normal liver function tests, TSH 0.795 11/10/2018 Hemoglobin A1c 5.8% 05/25/2020 Creatinine 0.69, potassium 4.4, hemoglobin 15.1 ALT 52, TSH 1.99, Hemoglobin A1c 6.0%  Recent Lipid Panel    Component Value Date/Time   CHOL 124 04/02/2017 1032   TRIG 83 04/02/2017 1032   HDL 46 04/02/2017 1032   CHOLHDL 2.7 04/02/2017 1032   VLDL 18 07/10/2016 0925   LDLCALC 61 04/02/2017 1032  05/10/2019 Total cholesterol 107, HDL 43, LDL 50, triglycerides 61 05/25/2020 Cholesterol 124, HDL 45, LDL 63, triglycerides 80  Physical Exam:    VS:  BP (!) 150/84   Pulse (!) 53   Ht 6' (1.829 m)   Wt 281 lb (127.5 kg)   SpO2 94%   BMI 38.11 kg/m     Wt Readings from Last 3 Encounters:  03/02/21 281 lb (127.5 kg)  10/20/20 270 lb 6.4 oz (122.7 kg)  10/15/19 255 lb 6.4 oz (115.8 kg)      General: Alert, oriented x3, no distress, severely obese Head: no evidence of trauma, PERRL, EOMI, no exophtalmos or lid lag, no myxedema, no xanthelasma; normal ears, nose and oropharynx Neck: normal jugular venous pulsations and no hepatojugular reflux; brisk carotid pulses without delay and no carotid bruits Chest: clear to auscultation, no signs of consolidation by percussion or palpation, normal fremitus, symmetrical and full respiratory excursions Cardiovascular: normal position and quality of the apical impulse, regular rhythm with frequent ectopic beats, normal first and second heart sounds, no murmurs, rubs or gallops Abdomen: no tenderness or distention,  no masses by palpation, no abnormal pulsatility or arterial bruits, normal bowel sounds, no hepatosplenomegaly Extremities: no clubbing, cyanosis or edema; 2+ radial, ulnar and brachial pulses bilaterally; 2+ right femoral, posterior tibial and dorsalis pedis pulses; 2+ left femoral, posterior tibial and dorsalis pedis pulses; no subclavian or femoral bruits Neurological: grossly nonfocal Psych: Normal mood and affect    ASSESSMENT:    1. Shortness of breath   2. Somnolence, daytime   3. PVC's (premature ventricular contractions)   4. Essential hypertension   5. Hypercholesterolemia   6. Severe obesity (BMI 35.0-39.9) with comorbidity (HCC)   7. History of cigarette smoking      PLAN:    In order of problems listed  above:  Dyspnea on exertion: Check an echocardiogram.  Could be pulmonary hypertension related to untreated obstructive sleep apnea.  No clinical signs to suggest left heart failure.  Chest pressure occurs at rest, not with activity.  May consider work-up for coronary insufficiency based on the findings of his echocardiogram.  But major suspicion lives elsewhere with sleep apnea and ventricular arrhythmia as causes for his symptoms. Hypersomnolence: Sounds like he could indeed have obstructive sleep apnea with his additional weight gain.  We will schedule for an outpatient sleep study.  This could also explain his memory difficulties. PVCs: Although he is not always aware of the palpitations, his episodes of chest pressure appear to be associated with increased frequency of PVCs.  His chest pressure occurs at rest, never with physical activity and does not sound consistent with coronary insufficiency.  We can increase the dose of his metoprolol succinate to 50 mg daily to see if this provides any symptom relief.  Hard to say with certainty, but he may have outflow tract PVCs (LVOT origin?).  If medical therapy does not provide relief, we can consider referral to EP. HTN: Blood  pressure is a little bit high today, should improve with higher beta-blocker dose. HLP: Lipid profile is not bad, but the HDL will not improve without significant weight loss. Obesity: He has continued to gain weight.  Although he is physically active, this is just at work and he does not exercise in between.  We will need to work on cutting portion size and eliminating excessive carbohydrates and his diet. History of smoking, 40 pack years:.  Congratulated him on staying quit from smoking cigarettes, although this clearly contributed to his weight gain.  No clear symptoms of COPD.   Medication Adjustments/Labs and Tests Ordered: Current medicines are reviewed at length with the patient today.  Concerns regarding medicines are outlined above.  Orders Placed This Encounter  Procedures   EKG 12-Lead   ECHOCARDIOGRAM COMPLETE   Split night study     No orders of the defined types were placed in this encounter.   Patient Instructions  Medication Instructions:  No changes *If you need a refill on your cardiac medications before your next appointment, please call your pharmacy*   Lab Work: None ordered If you have labs (blood work) drawn today and your tests are completely normal, you will receive your results only by: MyChart Message (if you have MyChart) OR A paper copy in the mail If you have any lab test that is abnormal or we need to change your treatment, we will call you to review the results.   Testing/Procedures: Your physician has requested that you have an echocardiogram. Echocardiography is a painless test that uses sound waves to create images of your heart. It provides your doctor with information about the size and shape of your heart and how well your heart's chambers and valves are working. You may receive an ultrasound enhancing agent through an IV if needed to better visualize your heart during the echo.This procedure takes approximately one hour. There are no  restrictions for this procedure. This will take place at the [address]   Your physician has recommended that you have a sleep study. This test records several body functions during sleep, including: brain activity, eye movement, oxygen and carbon dioxide blood levels, heart rate and rhythm, breathing rate and rhythm, the flow of air through your mouth and nose, snoring, body muscle movements, and chest and belly movement.  Follow-Up: At [facility], you and your health needs are our priority.  As part of our continuing mission to provide you with exceptional heart care, we have created designated Provider Care Teams.  These Care Teams include your primary Cardiologist (physician) and Advanced Practice Providers (APPs -  Physician Assistants and Nurse Practitioners) who all work together to provide you with the care you need, when you need it.  We recommend signing up for the patient portal called "MyChart".  Sign up information is provided on this After Visit Summary.  MyChart is used to connect with patients for Virtual Visits (Telemedicine).  Patients are able to view lab/test results, encounter notes, upcoming appointments, etc.  Non-urgent messages can be sent to your provider as well.   To learn more about what you can do with MyChart, go to [email or web address].    Your next appointment:   4 month(s)  The format for your next appointment:   In Person  Provider:   Thurmon Fair, MD      Signed, Thurmon Fair, MD  03/02/2021 11:12 AM    [facility]

## 2021-03-06 ENCOUNTER — Telehealth: Payer: Self-pay | Admitting: *Deleted

## 2021-03-06 ENCOUNTER — Other Ambulatory Visit: Payer: Self-pay | Admitting: Cardiovascular Disease

## 2021-03-06 DIAGNOSIS — R0683 Snoring: Secondary | ICD-10-CM

## 2021-03-06 DIAGNOSIS — R400 Somnolence: Secondary | ICD-10-CM

## 2021-03-06 NOTE — Telephone Encounter (Signed)
Left HST appointment details on voicemail. 

## 2021-03-18 ENCOUNTER — Encounter: Payer: BC Managed Care – PPO | Admitting: Cardiovascular Disease

## 2021-03-20 ENCOUNTER — Encounter: Payer: Self-pay | Admitting: Cardiovascular Disease

## 2021-04-06 ENCOUNTER — Ambulatory Visit: Payer: BC Managed Care – PPO | Attending: Cardiovascular Disease | Admitting: Cardiovascular Disease

## 2021-04-06 DIAGNOSIS — R400 Somnolence: Secondary | ICD-10-CM | POA: Diagnosis not present

## 2021-04-06 DIAGNOSIS — G4733 Obstructive sleep apnea (adult) (pediatric): Secondary | ICD-10-CM | POA: Insufficient documentation

## 2021-04-06 DIAGNOSIS — R0683 Snoring: Secondary | ICD-10-CM | POA: Diagnosis not present

## 2021-04-11 ENCOUNTER — Ambulatory Visit (HOSPITAL_COMMUNITY)
Admission: RE | Admit: 2021-04-11 | Discharge: 2021-04-11 | Disposition: A | Discharge status: Home / Self Care | Payer: BC Managed Care – PPO | Source: Ambulatory Visit | Attending: Cardiovascular Disease | Admitting: Cardiovascular Disease

## 2021-04-11 ENCOUNTER — Other Ambulatory Visit: Payer: Self-pay

## 2021-04-11 DIAGNOSIS — R0602 Shortness of breath: Secondary | ICD-10-CM | POA: Insufficient documentation

## 2021-04-11 LAB — ECHOCARDIOGRAM COMPLETE
AR max vel: 2.64 cm2
AV Area VTI: 3.1 cm2
AV Area mean vel: 2.05 cm2
AV Mean grad: 5 mmHg
AV Peak grad: 8.2 mmHg
Ao pk vel: 1.43 m/s
Area-P 1/2: 3.07 cm2
Calc EF: 58.6 %
MV VTI: 3.97 cm2
S' Lateral: 4 cm
Single Plane A2C EF: 62.6 %
Single Plane A4C EF: 52.5 %

## 2021-04-11 NOTE — Progress Notes (Incomplete)
*  PRELIMINARY RESULTS* Echocardiogram 2D Echocardiogram has been performed.  Carolyne Fiscal 04/11/2021, 1:53 PM

## 2021-04-12 ENCOUNTER — Other Ambulatory Visit: Payer: Self-pay

## 2021-04-12 DIAGNOSIS — I493 Ventricular premature depolarization: Secondary | ICD-10-CM

## 2021-04-20 DIAGNOSIS — M25561 Pain in right knee: Secondary | ICD-10-CM | POA: Diagnosis not present

## 2021-04-30 ENCOUNTER — Telehealth: Payer: Self-pay | Admitting: *Deleted

## 2021-04-30 ENCOUNTER — Encounter: Payer: Self-pay | Admitting: Cardiovascular Disease

## 2021-04-30 NOTE — Telephone Encounter (Signed)
-----   Message from Lennette Bihari, MD sent at 04/30/2021  8:57 AM EST ----- Burna Mortimer, please notify the patient regarding the results of the study.  Can initiate AutoPap as prescribed if unable to obtain in lab study.

## 2021-04-30 NOTE — Telephone Encounter (Signed)
Left message to return a call to discuss HST results and recommendations. 

## 2021-04-30 NOTE — Procedures (Signed)
° ° °                              [place] [facility]         Patient Name: Terrence Robinson, Terrence Robinson Date: 04/07/2021 Gender: Male D.O.B: 1966/03/10 Age (years): 7 Referring Provider: Rachelle Hora Croitoru Height (inches): 72 Interpreting Physician: Nicki Guadalajara MD, ABSM Weight (lbs): 281 RPSGT: Alfonso Ellis BMI: 38 MRN: [id number] Neck Size: <br>  CLINICAL INFORMATION Sleep Study Type: HST  Indication for sleep study: snoring, somnolence  Epworth Sleepiness Score: 3  SLEEP STUDY TECHNIQUE A multi-channel overnight portable sleep study was performed. The channels recorded were: nasal airflow, thoracic respiratory movement, and oxygen saturation with a pulse oximetry. Snoring was also monitored.  MEDICATIONS atorvastatin (LIPITOR) 40 MG tablet gabapentin (NEURONTIN) 400 MG capsule lisinopril-hydrochlorothiazide (ZESTORETIC) 20-12.5 MG tablet metoprolol succinate (TOPROL-XL) 50 MG 24 hr tablet varenicline (CHANTIX STARTING MONTH PAK) 0.5 MG X 11 & 1 MG X 42 tablet   Patient self administered medications include: N/A.  SLEEP ARCHITECTURE Patient was studied for 506.5 minutes. The sleep efficiency was 95.6 % and the patient was supine for 5.3%. The arousal index was 0.0 per hour.  RESPIRATORY PARAMETERS The overall AHI was 14.1 per hour, with a central apnea index of 2.4 per hour. There is a significant positional  component with supine sleep AHI 31.2/h versus non- supine sleep AHI 13.1/h.   The oxygen nadir was 80% during sleep.  CARDIAC DATA Mean heart rate during sleep was 70.1 bpm.  IMPRESSIONS - Mild obstructive sleep apnea overall (AHI 14.1/h); however, sleep apnea was severe with supine sleep (AHI 31.2/h).  - No significant central sleep apnea occurred during this study (CAI 2.4/h). - Moderate  oxygen desaturation to a nadir of  80%. - Patient snored for 364.8 minutes (72%) during the sleep.  DIAGNOSIS - Obstructive Sleep Apnea (G47.33)  RECOMMENDATIONS - Therapeutic  CPAP titration to determine optimal pressure required to alleviate sleep disordered breathing. If unable for an in-lab titration initiate Auto-PAP with EPR of 3 at 6 - 16 cm of water. - Effort should be made to optimize nasal and oropharyngeal patency. - Positional therapy avoiding supine position during sleep. - Avoid alcohol, sedatives and other CNS depressants that may worsen sleep apnea and disrupt normal sleep architecture. - Sleep hygiene should be reviewed to assess factors that may improve sleep quality. - Weight management and regular exercise should be initiated or continued. - Recommend a download and sleep clinic evaluation after one month of therapy.  [Electronically signed] 04/30/2021 08:51 AM  Nicki Guadalajara MD, [facility], ABSM Diplomate, American Board of Sleep Medicine   NPI: [id number] [facility] PH: [phone]   FX: [phone] ACCREDITED BY THE AMERICAN ACADEMY OF SLEEP MEDICINE

## 2021-05-02 DIAGNOSIS — I493 Ventricular premature depolarization: Secondary | ICD-10-CM | POA: Diagnosis not present

## 2021-05-02 LAB — CBC
Hematocrit: 42.2 % (ref 37.5–51.0)
Hemoglobin: 14.5 g/dL (ref 13.0–17.7)
MCH: 29.8 pg (ref 26.6–33.0)
MCHC: 34.4 g/dL (ref 31.5–35.7)
MCV: 87 fL (ref 79–97)
Platelets: 381 10*3/uL (ref 150–450)
RBC: 4.86 x10E6/uL (ref 4.14–5.80)
RDW: 12.2 % (ref 11.6–15.4)
WBC: 9.2 10*3/uL (ref 3.4–10.8)

## 2021-05-03 LAB — BASIC METABOLIC PANEL WITH GFR
BUN/Creatinine Ratio: 13 (ref 9–20)
BUN: 11 mg/dL (ref 6–24)
CO2: 26 mmol/L (ref 20–29)
Calcium: 9.9 mg/dL (ref 8.7–10.2)
Chloride: 98 mmol/L (ref 96–106)
Creatinine, Ser: 0.83 mg/dL (ref 0.76–1.27)
Glucose: 126 mg/dL — ABNORMAL HIGH (ref 70–99)
Potassium: 4.4 mmol/L (ref 3.5–5.2)
Sodium: 141 mmol/L (ref 134–144)
eGFR: 104 mL/min/1.73

## 2021-05-08 ENCOUNTER — Telehealth (HOSPITAL_COMMUNITY): Payer: Self-pay | Admitting: *Deleted

## 2021-05-08 NOTE — Telephone Encounter (Signed)
Attempted to call patient regarding upcoming cardiac MRI appointment. Left message on voicemail with name and callback number  Ludwika Rodd RN Navigator Cardiac Imaging [facility] [phone] Office [phone] Cell  

## 2021-05-09 ENCOUNTER — Other Ambulatory Visit: Payer: Self-pay

## 2021-05-09 ENCOUNTER — Ambulatory Visit (HOSPITAL_COMMUNITY)
Admission: RE | Admit: 2021-05-09 | Discharge: 2021-05-09 | Disposition: A | Discharge status: Home / Self Care | Payer: BC Managed Care – PPO | Source: Ambulatory Visit | Attending: Cardiovascular Disease | Admitting: Cardiovascular Disease

## 2021-05-09 DIAGNOSIS — I493 Ventricular premature depolarization: Secondary | ICD-10-CM | POA: Insufficient documentation

## 2021-05-09 MED ORDER — GADOBUTROL 1 MMOL/ML IV SOLN
10.0000 mL | Freq: Once | INTRAVENOUS | Status: AC | PRN
  Administered 2021-05-09: 10 mL via INTRAVENOUS

## 2021-05-11 ENCOUNTER — Telehealth: Payer: Self-pay | Admitting: *Deleted

## 2021-05-11 NOTE — Telephone Encounter (Signed)
APAP order sent to [facility]. Patient's [employer] denied CPAP titration.

## 2021-05-11 NOTE — Telephone Encounter (Signed)
Patient returned a call to me and was given his HST results and recommendations. He voiced understanding of what was told to him and agrees to proceed with CPAP titration.

## 2021-05-25 DIAGNOSIS — M1711 Unilateral primary osteoarthritis, right knee: Secondary | ICD-10-CM | POA: Diagnosis not present

## 2021-05-29 DIAGNOSIS — I10 Essential (primary) hypertension: Secondary | ICD-10-CM | POA: Diagnosis not present

## 2021-05-29 DIAGNOSIS — Z6836 Body mass index (BMI) 36.0-36.9, adult: Secondary | ICD-10-CM | POA: Diagnosis not present

## 2021-05-29 DIAGNOSIS — M5441 Lumbago with sciatica, right side: Secondary | ICD-10-CM | POA: Diagnosis not present

## 2021-05-29 DIAGNOSIS — G8929 Other chronic pain: Secondary | ICD-10-CM | POA: Diagnosis not present

## 2021-05-30 DIAGNOSIS — G4733 Obstructive sleep apnea (adult) (pediatric): Secondary | ICD-10-CM | POA: Diagnosis not present

## 2021-06-03 DIAGNOSIS — Z20822 Contact with and (suspected) exposure to covid-19: Secondary | ICD-10-CM | POA: Diagnosis not present

## 2021-06-05 DIAGNOSIS — U071 COVID-19: Secondary | ICD-10-CM | POA: Diagnosis not present

## 2021-06-27 DIAGNOSIS — I10 Essential (primary) hypertension: Secondary | ICD-10-CM | POA: Diagnosis not present

## 2021-06-27 DIAGNOSIS — K219 Gastro-esophageal reflux disease without esophagitis: Secondary | ICD-10-CM | POA: Diagnosis not present

## 2021-06-27 DIAGNOSIS — M5136 Other intervertebral disc degeneration, lumbar region: Secondary | ICD-10-CM | POA: Diagnosis not present

## 2021-06-27 DIAGNOSIS — Z6837 Body mass index (BMI) 37.0-37.9, adult: Secondary | ICD-10-CM | POA: Diagnosis not present

## 2021-06-27 DIAGNOSIS — I517 Cardiomegaly: Secondary | ICD-10-CM | POA: Diagnosis not present

## 2021-06-27 DIAGNOSIS — M1991 Primary osteoarthritis, unspecified site: Secondary | ICD-10-CM | POA: Diagnosis not present

## 2021-06-27 DIAGNOSIS — E6609 Other obesity due to excess calories: Secondary | ICD-10-CM | POA: Diagnosis not present

## 2021-06-27 DIAGNOSIS — E782 Mixed hyperlipidemia: Secondary | ICD-10-CM | POA: Diagnosis not present

## 2021-06-27 DIAGNOSIS — I493 Ventricular premature depolarization: Secondary | ICD-10-CM | POA: Diagnosis not present

## 2021-06-27 DIAGNOSIS — Z1331 Encounter for screening for depression: Secondary | ICD-10-CM | POA: Diagnosis not present

## 2021-06-27 DIAGNOSIS — Z0000 Encounter for general adult medical examination without abnormal findings: Secondary | ICD-10-CM | POA: Diagnosis not present

## 2021-06-29 DIAGNOSIS — Z01818 Encounter for other preprocedural examination: Secondary | ICD-10-CM | POA: Diagnosis not present

## 2021-06-30 DIAGNOSIS — G4733 Obstructive sleep apnea (adult) (pediatric): Secondary | ICD-10-CM | POA: Diagnosis not present

## 2021-07-03 ENCOUNTER — Telehealth: Payer: Self-pay

## 2021-07-03 NOTE — Telephone Encounter (Signed)
? ?  Pre-operative Risk Assessment  ?  ?Patient Name: Damaria Stofko Hadden  ?DOB: 1965-06-13 ?MRN: [id number]  ? ?  ? ?Request for Surgical Clearance   ? ?Procedure:   Colonoscopy cancer screening ? ?Date of Surgery:  Clearance 09/06/20                              ?   ?Surgeon:  Dr. Dulce Sellar ?Surgeon's Group or Practice Name:  [facility] ?Phone number:  [phone] ?Fax number:  [phone] ?4. What type of clearance is requested?  Medical or Cardiac Clearance only?  Pharmacy Clearance Only (Request is to hold medication only)?  Or Both?  Press F2 and select the clearance requested.  If both are needed, select both from the drop down list.     :1}  ?Type of Clearance Requested:   ?- Medical  ?  ?Type of Anesthesia:  Not Indicated ?  ?Additional requests/questions:   ? ?Signed, ?Melina Modena   ?07/03/2021, 7:20 AM  ? ?

## 2021-07-03 NOTE — Telephone Encounter (Signed)
Letter sent via epic, attached to his appointment encounter later this week. ?

## 2021-07-03 NOTE — Telephone Encounter (Signed)
Patient has upcoming follow-up with Dr. Royann Shivers in 3 days, cardiac clearance can be addressed at that time by MD. ?

## 2021-07-03 NOTE — Progress Notes (Signed)
?Cardiology Office Note (new patient):   ? ?Date:  07/06/2021  ? ?ID:  Terrence Robinson, DOB 05-May-1965, MRN [id number] ? ?PCP:  Elfredia Nevins, MD  ?[facility] Cardiologist:  Thurmon Fair, MD  ?[facility] Electrophysiologist:  None  ? ?Referring MD: Elfredia Nevins, MD  ? ?Chief Complaint  ?Patient presents with  ? Palpitations  ? ? ? ? ?History of Present Illness:   ? ?Terrence Robinson is a 56 y.o. male with a hx of essential hypertension, previous smoker, hypercholesterolemia, who was initially seen due to palpitations associated with PVCs. ? ?His sleep study confirmed obstructive sleep apnea, which was severe in the supine position.  He has been using CPAP now for just over a month.  He has not noticed any improvement in his energy level yet.  He does complain of fatigue and wonders whether this could also be due to his beta-blocker.  He has not had dizziness or syncope.  The palpitations and chest tightness are not as frequent, may be related to anxiety or other strong emotions.  Working on improving his diet and has managed to lose a couple of pounds.  His weakness is bread and potatoes.  He has not had any chest pain, orthopnea, PND or lower extremity edema.  He has problems with pain in his right knee although he is not sure that it is not referred pain from his back problems.  His insurance will cover hyaluronic acid injections.  Steroid injections did not help. ? ?After quitting smoking he has gained about 30 pounds.  He has gained about 60 pounds in the last 4 years.   ? ?A Kardiamobile transmission that he submitted for chest pressure shows extremely frequent monomorphic PVCs.  On his ECG today, as on previous tracings he has frequent monomorphic PVCs that are probably of LVOT origin (RBBB morphology in lead V1, monophasic in the limb leads with inferior axis, transition lead in V2).   ? ?Echocardiogram in January 2023 showed mild dilation of both the right and left ventricles, but with preserved  systolic function.  No significant valve problems are seen.  Please continue to keep him n.p.o.  Cardiac MRI performed in February 2023 showed normal left and right ventricular systolic function (LVEF 56%, RVEF 53%) and no evidence of late gadolinium enhancement. ? ?Past Medical History:  ?Diagnosis Date  ? Degenerative joint disease (DJD) of lumbar spine   ? back DJD  ? Essential hypertension   ? Generalized anxiety disorder   ? GERD (gastroesophageal reflux disease)   ? HLD (hyperlipidemia) 04/12/2016  ? Hypertension   ? Neuromuscular disorder (HCC)   ? sciatica  ? Pneumonia   ? Tobacco abuse 04/05/2016  ? ? ?Past Surgical History:  ?Procedure Laterality Date  ? AMPUTATION  2003  ? finger left hand  ? BACK SURGERY    ? x 3  ? CARPAL TUNNEL RELEASE Right   ? SPINE SURGERY    ? 2001, 2010, 2012  ? ? ?Current Medications: ?Current Meds  ?Medication Sig  ? atorvastatin (LIPITOR) 40 MG tablet Take 1 tablet (40 mg total) by mouth daily.  ? esomeprazole (NEXIUM) 40 MG capsule Take 40 mg by mouth daily.  ? lisinopril-hydrochlorothiazide (ZESTORETIC) 20-12.5 MG tablet Take 2 tablets by mouth daily.  ?  ? ?Allergies:   Erythromycin  ? ?Social History  ? ?Socioeconomic History  ? Marital status: Married  ?  Spouse name: wendy  ? Number of children: 2  ? Years of education:  12  ? Highest education level: Not on file  ?Occupational History  ? Occupation: Set designer  ?Tobacco Use  ? Smoking status: Former  ?  Packs/day: 1.00  ?  Types: Cigarettes  ?  Start date: 04/01/1986  ?  Quit date: 07/30/2019  ?  Years since quitting: 1.9  ? Smokeless tobacco: Never  ?Substance and Sexual Activity  ? Alcohol use: No  ? Drug use: No  ? Sexual activity: Yes  ?  Birth control/protection: Post-menopausal  ?Other Topics Concern  ? Not on file  ?Social History Narrative  ? Lives with wife Toniann Fail   ? one son-  Fransisco Beau in college  ? Maintenance at [employer]   ? ?Social Determinants of Health  ? ?Financial Resource Strain: Not on file  ?Food  Insecurity: Not on file  ?Transportation Needs: Not on file  ?Physical Activity: Not on file  ?Stress: Not on file  ?Social Connections: Not on file  ?  ? ?Family History: ?The patient's family history includes Arthritis in his father; Cancer in his father and paternal grandfather; Diabetes in his paternal grandmother; Heart disease in his father; Hyperlipidemia in his brother; Hypertension in his father; Kidney disease in his father. ? ?ROS:   ?Please see the history of present illness.    ?All other systems reviewed and are negative. ? ?EKGs/Labs/Other Studies Reviewed:   ? ? ?Echocardiogram 04/11/2021  ? 1. Left ventricular ejection fraction, by estimation, is 55 to 60%. The left ventricle has normal function. The left ventricle has no regional wall motion abnormalities. The left ventricular internal cavity size was mildly dilated. There is mild left ventricular hypertrophy. Left ventricular diastolic parameters were normal.  ? 2. Right ventricular systolic function is normal. The right ventricular size is mildly enlarged. Tricuspid regurgitation signal is inadequate for assessing PA pressure.  ? 3. The mitral valve is normal in structure. No evidence of mitral valve regurgitation. No evidence of mitral stenosis.  ? 4. The aortic valve is tricuspid. Aortic valve regurgitation is not visualized. No aortic stenosis is present.  ? 5. The inferior vena cava is normal in size with greater than 50% respiratory variability, suggesting right atrial pressure of 3 mmHg.  ? ?Cardiac MRI 05/09/2021: ?1. The left ventricle is mildly dilated with normal wall thickness.EF 56% with normal wall motion. ?  ?2.  Normal RV size and systolic function, EF 53%. ?  ?3. No myocardial LGE, so no definitive evidence for prior MI, ?infiltrative disease, or myocarditis. ?  ?4.  Normal T1 indices. ? ?EKG:  EKG is  ordered today.  Personally reviewed shows sinus rhythm with frequent monomorphic PVCs with possible LVOT outflow tract  origin. ? ?Recent Labs: ?05/02/2021: BUN 11; Creatinine, Ser 0.83; Hemoglobin 14.5; Platelets 381; Potassium 4.4; Sodium 141  ?05/10/2019 ?Hemoglobin 15.1, creatinine 0.79, potassium 4.4, normal liver function tests, TSH 0.795 ?11/10/2018 ?Hemoglobin A1c 5.8% ?05/25/2020 ?Creatinine 0.69, potassium 4.4, hemoglobin 15.1 ALT 52, TSH 1.99, ?Hemoglobin A1c 6.0% ? ?Recent Lipid Panel ?   ?Component Value Date/Time  ? CHOL 124 04/02/2017 1032  ? TRIG 83 04/02/2017 1032  ? HDL 46 04/02/2017 1032  ? CHOLHDL 2.7 04/02/2017 1032  ? VLDL 18 07/10/2016 0925  ? LDLCALC 61 04/02/2017 1032  ?05/10/2019 ?Total cholesterol 107, HDL 43, LDL 50, triglycerides 61 ?66/44/0347 ?Cholesterol 124, HDL 45, LDL 63, triglycerides 80 ? ?Physical Exam:   ? ?VS:  BP 118/80   Pulse 74   Ht 6' (1.829 m)   Wt 270 lb 6.4  oz (122.7 kg)   SpO2 97%   BMI 36.67 kg/m?    ? ?Wt Readings from Last 3 Encounters:  ?07/06/21 270 lb 6.4 oz (122.7 kg)  ?03/02/21 281 lb (127.5 kg)  ?10/20/20 270 lb 6.4 oz (122.7 kg)  ?  ? ?General: Alert, oriented x3, no distress, severely obese ?Head: no evidence of trauma, PERRL, EOMI, no exophtalmos or lid lag, no myxedema, no xanthelasma; normal ears, nose and oropharynx ?Neck: normal jugular venous pulsations and no hepatojugular reflux; brisk carotid pulses without delay and no carotid bruits ?Chest: clear to auscultation, no signs of consolidation by percussion or palpation, normal fremitus, symmetrical and full respiratory excursions ?Cardiovascular: normal position and quality of the apical impulse, regular rhythm baseline with frequent ectopy, normal first and second heart sounds, no murmurs, rubs or gallops ?Abdomen: no tenderness or distention, no masses by palpation, no abnormal pulsatility or arterial bruits, normal bowel sounds, no hepatosplenomegaly ?Extremities: no clubbing, cyanosis or edema; 2+ radial, ulnar and brachial pulses bilaterally; 2+ right femoral, posterior tibial and dorsalis pedis pulses; 2+ left  femoral, posterior tibial and dorsalis pedis pulses; no subclavian or femoral bruits ?Neurological: grossly nonfocal ?Psych: Normal mood and affect ? ? ? ? ?ASSESSMENT:   ? ?1. Dilated cardiomyopathy (HCC)   ?2. PVC's (pre

## 2021-07-06 ENCOUNTER — Encounter: Payer: Self-pay | Admitting: Cardiovascular Disease

## 2021-07-06 ENCOUNTER — Ambulatory Visit: Payer: BC Managed Care – PPO | Admitting: Cardiovascular Disease

## 2021-07-06 VITALS — BP 118/80 | HR 74 | Ht 72.0 in | Wt 270.4 lb

## 2021-07-06 DIAGNOSIS — I10 Essential (primary) hypertension: Secondary | ICD-10-CM

## 2021-07-06 DIAGNOSIS — E785 Hyperlipidemia, unspecified: Secondary | ICD-10-CM

## 2021-07-06 DIAGNOSIS — I493 Ventricular premature depolarization: Secondary | ICD-10-CM | POA: Diagnosis not present

## 2021-07-06 DIAGNOSIS — I420 Dilated cardiomyopathy: Secondary | ICD-10-CM | POA: Diagnosis not present

## 2021-07-06 DIAGNOSIS — G4733 Obstructive sleep apnea (adult) (pediatric): Secondary | ICD-10-CM

## 2021-07-06 DIAGNOSIS — Z87898 Personal history of other specified conditions: Secondary | ICD-10-CM | POA: Insufficient documentation

## 2021-07-06 DIAGNOSIS — Z87891 Personal history of nicotine dependence: Secondary | ICD-10-CM

## 2021-07-06 HISTORY — DX: Obstructive sleep apnea (adult) (pediatric): G47.33

## 2021-07-06 NOTE — Patient Instructions (Signed)

## 2021-07-13 DIAGNOSIS — M4726 Other spondylosis with radiculopathy, lumbar region: Secondary | ICD-10-CM | POA: Diagnosis not present

## 2021-07-13 DIAGNOSIS — M4316 Spondylolisthesis, lumbar region: Secondary | ICD-10-CM | POA: Diagnosis not present

## 2021-07-13 DIAGNOSIS — M5116 Intervertebral disc disorders with radiculopathy, lumbar region: Secondary | ICD-10-CM | POA: Diagnosis not present

## 2021-07-13 DIAGNOSIS — Z9889 Other specified postprocedural states: Secondary | ICD-10-CM | POA: Diagnosis not present

## 2021-07-20 DIAGNOSIS — M1711 Unilateral primary osteoarthritis, right knee: Secondary | ICD-10-CM | POA: Diagnosis not present

## 2021-07-20 DIAGNOSIS — M25561 Pain in right knee: Secondary | ICD-10-CM | POA: Diagnosis not present

## 2021-07-24 DIAGNOSIS — M48062 Spinal stenosis, lumbar region with neurogenic claudication: Secondary | ICD-10-CM | POA: Diagnosis not present

## 2021-07-24 DIAGNOSIS — M4316 Spondylolisthesis, lumbar region: Secondary | ICD-10-CM | POA: Diagnosis not present

## 2021-07-30 DIAGNOSIS — G4733 Obstructive sleep apnea (adult) (pediatric): Secondary | ICD-10-CM | POA: Diagnosis not present

## 2021-08-23 DIAGNOSIS — M48061 Spinal stenosis, lumbar region without neurogenic claudication: Secondary | ICD-10-CM | POA: Diagnosis not present

## 2021-08-23 DIAGNOSIS — Z9889 Other specified postprocedural states: Secondary | ICD-10-CM | POA: Diagnosis not present

## 2021-08-23 DIAGNOSIS — M4316 Spondylolisthesis, lumbar region: Secondary | ICD-10-CM | POA: Diagnosis not present

## 2021-08-30 DIAGNOSIS — G4733 Obstructive sleep apnea (adult) (pediatric): Secondary | ICD-10-CM | POA: Diagnosis not present

## 2021-09-17 DIAGNOSIS — J069 Acute upper respiratory infection, unspecified: Secondary | ICD-10-CM | POA: Diagnosis not present

## 2021-09-18 DIAGNOSIS — M48062 Spinal stenosis, lumbar region with neurogenic claudication: Secondary | ICD-10-CM | POA: Diagnosis not present

## 2021-09-29 DIAGNOSIS — G4733 Obstructive sleep apnea (adult) (pediatric): Secondary | ICD-10-CM | POA: Diagnosis not present

## 2021-10-08 DIAGNOSIS — M1711 Unilateral primary osteoarthritis, right knee: Secondary | ICD-10-CM | POA: Diagnosis not present

## 2021-10-30 DIAGNOSIS — G4733 Obstructive sleep apnea (adult) (pediatric): Secondary | ICD-10-CM | POA: Diagnosis not present

## 2021-11-22 DIAGNOSIS — G5602 Carpal tunnel syndrome, left upper limb: Secondary | ICD-10-CM | POA: Diagnosis not present

## 2021-11-22 DIAGNOSIS — M13842 Other specified arthritis, left hand: Secondary | ICD-10-CM | POA: Diagnosis not present

## 2021-11-22 DIAGNOSIS — M65842 Other synovitis and tenosynovitis, left hand: Secondary | ICD-10-CM | POA: Diagnosis not present

## 2021-11-22 DIAGNOSIS — M79642 Pain in left hand: Secondary | ICD-10-CM | POA: Diagnosis not present

## 2021-11-30 DIAGNOSIS — G4733 Obstructive sleep apnea (adult) (pediatric): Secondary | ICD-10-CM | POA: Diagnosis not present

## 2021-12-21 DIAGNOSIS — G4733 Obstructive sleep apnea (adult) (pediatric): Secondary | ICD-10-CM | POA: Diagnosis not present

## 2021-12-30 DIAGNOSIS — G4733 Obstructive sleep apnea (adult) (pediatric): Secondary | ICD-10-CM | POA: Diagnosis not present

## 2022-01-08 DIAGNOSIS — M4316 Spondylolisthesis, lumbar region: Secondary | ICD-10-CM | POA: Diagnosis not present

## 2022-01-09 ENCOUNTER — Other Ambulatory Visit: Payer: Self-pay | Admitting: Cardiovascular Disease

## 2022-01-09 DIAGNOSIS — I493 Ventricular premature depolarization: Secondary | ICD-10-CM

## 2022-01-16 DIAGNOSIS — M79642 Pain in left hand: Secondary | ICD-10-CM | POA: Diagnosis not present

## 2022-01-16 DIAGNOSIS — M13849 Other specified arthritis, unspecified hand: Secondary | ICD-10-CM | POA: Diagnosis not present

## 2022-01-16 DIAGNOSIS — M65849 Other synovitis and tenosynovitis, unspecified hand: Secondary | ICD-10-CM | POA: Diagnosis not present

## 2022-01-16 DIAGNOSIS — M65841 Other synovitis and tenosynovitis, right hand: Secondary | ICD-10-CM | POA: Diagnosis not present

## 2022-01-16 DIAGNOSIS — G5602 Carpal tunnel syndrome, left upper limb: Secondary | ICD-10-CM | POA: Diagnosis not present

## 2022-01-30 DIAGNOSIS — G4733 Obstructive sleep apnea (adult) (pediatric): Secondary | ICD-10-CM | POA: Diagnosis not present

## 2022-03-01 DIAGNOSIS — G4733 Obstructive sleep apnea (adult) (pediatric): Secondary | ICD-10-CM | POA: Diagnosis not present

## 2022-03-13 DIAGNOSIS — M65331 Trigger finger, right middle finger: Secondary | ICD-10-CM | POA: Diagnosis not present

## 2022-03-27 DIAGNOSIS — M25641 Stiffness of right hand, not elsewhere classified: Secondary | ICD-10-CM | POA: Diagnosis not present

## 2022-04-08 DIAGNOSIS — M25641 Stiffness of right hand, not elsewhere classified: Secondary | ICD-10-CM | POA: Diagnosis not present

## 2022-06-12 DIAGNOSIS — Z01818 Encounter for other preprocedural examination: Secondary | ICD-10-CM | POA: Diagnosis not present

## 2022-06-12 DIAGNOSIS — Z1211 Encounter for screening for malignant neoplasm of colon: Secondary | ICD-10-CM | POA: Diagnosis not present

## 2022-07-03 DIAGNOSIS — Z1331 Encounter for screening for depression: Secondary | ICD-10-CM | POA: Diagnosis not present

## 2022-07-03 DIAGNOSIS — E782 Mixed hyperlipidemia: Secondary | ICD-10-CM | POA: Diagnosis not present

## 2022-07-03 DIAGNOSIS — K219 Gastro-esophageal reflux disease without esophagitis: Secondary | ICD-10-CM | POA: Diagnosis not present

## 2022-07-03 DIAGNOSIS — G894 Chronic pain syndrome: Secondary | ICD-10-CM | POA: Diagnosis not present

## 2022-07-03 DIAGNOSIS — I10 Essential (primary) hypertension: Secondary | ICD-10-CM | POA: Diagnosis not present

## 2022-07-03 DIAGNOSIS — R7309 Other abnormal glucose: Secondary | ICD-10-CM | POA: Diagnosis not present

## 2022-07-03 DIAGNOSIS — Z0000 Encounter for general adult medical examination without abnormal findings: Secondary | ICD-10-CM | POA: Diagnosis not present

## 2022-07-03 DIAGNOSIS — I517 Cardiomegaly: Secondary | ICD-10-CM | POA: Diagnosis not present

## 2022-07-03 DIAGNOSIS — I493 Ventricular premature depolarization: Secondary | ICD-10-CM | POA: Diagnosis not present

## 2022-07-03 DIAGNOSIS — Z6837 Body mass index (BMI) 37.0-37.9, adult: Secondary | ICD-10-CM | POA: Diagnosis not present

## 2022-07-03 DIAGNOSIS — E6609 Other obesity due to excess calories: Secondary | ICD-10-CM | POA: Diagnosis not present

## 2022-07-12 DIAGNOSIS — M4316 Spondylolisthesis, lumbar region: Secondary | ICD-10-CM | POA: Diagnosis not present

## 2022-07-17 DIAGNOSIS — Z1211 Encounter for screening for malignant neoplasm of colon: Secondary | ICD-10-CM | POA: Diagnosis not present

## 2022-07-17 DIAGNOSIS — D125 Benign neoplasm of sigmoid colon: Secondary | ICD-10-CM | POA: Diagnosis not present

## 2022-07-17 DIAGNOSIS — K5730 Diverticulosis of large intestine without perforation or abscess without bleeding: Secondary | ICD-10-CM | POA: Diagnosis not present

## 2022-07-17 DIAGNOSIS — Z83719 Family history of colon polyps, unspecified: Secondary | ICD-10-CM | POA: Diagnosis not present

## 2022-07-30 ENCOUNTER — Encounter: Payer: Self-pay | Admitting: Cardiovascular Disease

## 2022-07-31 ENCOUNTER — Telehealth: Payer: Self-pay | Admitting: Cardiovascular Disease

## 2022-07-31 NOTE — Telephone Encounter (Signed)
What problem are you experiencing? Patient states that his mask has ripped and his DME company is requesting a new prescription. He never saw Dr. Tresa Endo for a compliance appt. He has not used his APAP in a week. He is scheduled for 09/30 with Dr. Tresa Endo as this is his first available. I have added him as high priority on his wait list - FYI.   Who is your medical equipment company? [facility]   Please route to the sleep study assistant.

## 2022-08-05 ENCOUNTER — Ambulatory Visit: Payer: BC Managed Care – PPO | Attending: Cardiovascular Disease | Admitting: Cardiovascular Disease

## 2022-08-05 ENCOUNTER — Encounter: Payer: Self-pay | Admitting: Cardiovascular Disease

## 2022-08-05 VITALS — BP 124/74 | HR 76 | Ht 72.0 in | Wt 267.2 lb

## 2022-08-05 DIAGNOSIS — I10 Essential (primary) hypertension: Secondary | ICD-10-CM

## 2022-08-05 DIAGNOSIS — E785 Hyperlipidemia, unspecified: Secondary | ICD-10-CM

## 2022-08-05 DIAGNOSIS — I493 Ventricular premature depolarization: Secondary | ICD-10-CM

## 2022-08-05 DIAGNOSIS — E669 Obesity, unspecified: Secondary | ICD-10-CM

## 2022-08-05 DIAGNOSIS — I420 Dilated cardiomyopathy: Secondary | ICD-10-CM

## 2022-08-05 DIAGNOSIS — G4733 Obstructive sleep apnea (adult) (pediatric): Secondary | ICD-10-CM | POA: Diagnosis not present

## 2022-08-05 DIAGNOSIS — Z9189 Other specified personal risk factors, not elsewhere classified: Secondary | ICD-10-CM

## 2022-08-05 NOTE — Progress Notes (Signed)
Cardiology Office Note    Date:  08/09/2022   ID:  ERNAN NAHAR, DOB May 16, 1965, MRN [id number]  PCP:  Elfredia Nevins, MD  Cardiologist:  Nicki Guadalajara, MD (sleep); Dr. Royann Shivers  New sleep consultation referred by Dr. Royann Shivers   History of Present Illness:  Terrence Robinson is a 57 y.o. male who is followed by Dr. Royann Shivers for cardiology care and sees Dr. Sherwood Gambler for primary care.  Terrence Robinson has a history of hypertension, hyperlipidemia, frequent PVCs, and had remote tobacco history.  After quitting smoking, he had gained approximately 60 pounds.  He has been documented to have frequent monomorphic PVCs felt to be LVOT origin.  An echo Doppler study in January 2023 showed normal LV function with EF 55 to 60% with mild LVH and mild dilation of both LV and RV.  Due to concerns for obstructive sleep apnea with symptoms of snoring, fatigue, nonrestorative sleep, nocturia and somnolence, he underwent a home sleep study.  This revealed mild overall sleep apnea with an AHI of 14.1/h.  There was a significant positional component with supine sleep AHI at 31.2/h. Therapeutic CPAP was recommended and apparently he received AutoPap therapy with a ResMed AirSense 11 AutoSet unit with set up date on May 30, 2021 at initial pressure range of 6 to 16 cm of water.  Apparently, the patient never had a sleep evaluation.  He had obtained his device through [facility] in [place].  Apparently, when he recently saw Dr. Royann Shivers on July 06, 2021 he was concerned that potential untreated sleep apnea may explain some of his complaints of hypersomnia, fatigue, poor memory and frequent palpitations.  Sleep evaluation was recommended and he presents today for his initial sleep consultation and evaluation with me.  I obtained a download from the patient's CPAP machine from April 4 through Aug 02, 2022.  Compliance is suboptimal with only 50% of usage days and average use at 5 hours and 52 minutes.  Apparently  he was having significant leak in his mask leading to recent not using his equipment.  He had an F20 mask and there was significant breakdown of 2 of these masks.  At his pressure set range at 6 to 16 cm of water, AHI is 2.1.  His 95th percentile pressure is 12.9 with maximum average pressure at 14.5.  He denies any significant daytime sleepiness.  He works the night shift and typically goes to bed at 9:30 AM and wakes up at 5 PM.  An Epworth Sleepiness Scale score was calculated in the office today and this endorsed at 2.  He presents for his initial evaluation.  Past Medical History:  Diagnosis Date   Degenerative joint disease (DJD) of lumbar spine    back DJD   Essential hypertension    Generalized anxiety disorder    GERD (gastroesophageal reflux disease)    HLD (hyperlipidemia) 04/12/2016   Hypertension    Neuromuscular disorder (HCC)    sciatica   OSA (obstructive sleep apnea) 07/06/2021   Pneumonia    Tobacco abuse 04/05/2016    Past Surgical History:  Procedure Laterality Date   AMPUTATION  2003   finger left hand   BACK SURGERY     x 3   CARPAL TUNNEL RELEASE Right    SPINE SURGERY     2001, 2010, 2012    Current Medications: Outpatient Medications Prior to Visit  Medication Sig Dispense Refill   atorvastatin (LIPITOR) 40 MG tablet Take 1 tablet (40 mg total)  by mouth daily. 90 tablet 3   esomeprazole (NEXIUM) 40 MG capsule Take 40 mg by mouth daily.     lisinopril-hydrochlorothiazide (ZESTORETIC) 20-12.5 MG tablet Take 2 tablets by mouth daily. 180 tablet 3   metoprolol succinate (TOPROL-XL) 50 MG 24 hr tablet TAKE 1 TABLET BY MOUTH DAILY. TAKE WITH OR IMMEDIATELY FOLLOWING A MEAL. 90 tablet 2   No facility-administered medications prior to visit.     Allergies:   Erythromycin   Social History   Socioeconomic History   Marital status: Married    Spouse name: wendy   Number of children: 2   Years of education: 12   Highest education level: Not on file   Occupational History   Occupation: Set designer  Tobacco Use   Smoking status: Former    Packs/day: 1    Types: Cigarettes    Start date: 04/01/1986    Quit date: 07/30/2019    Years since quitting: 3.0   Smokeless tobacco: Never  Substance and Sexual Activity   Alcohol use: No   Drug use: No   Sexual activity: Yes    Birth control/protection: Post-menopausal  Other Topics Concern   Not on file  Social History Narrative   Lives with wife Toniann Fail    one son-  Fransisco Beau in college   Maintenance at [employer]    Social Determinants of Health   Financial Resource Strain: Not on file  Food Insecurity: Not on file  Transportation Needs: Not on file  Physical Activity: Not on file  Stress: Not on file  Social Connections: Not on file    Socially, he was born in [place].  He is married for 32 years.  He has 2 children ages 64 and 60.  He works at [employer] in [employer] in [place].  Family History:  The patient's family history includes Arthritis in his father; Cancer in his father and paternal grandfather; Diabetes in his paternal grandmother; Heart disease in his father; Hyperlipidemia in his brother; Hypertension in his father; Kidney disease in his father.  His father died at age 31.  Mother unfortunately died at age 89 in a car accident.  He has 1 brother.  ROS General: Negative; No fevers, chills, or night sweats;  HEENT: Negative; No changes in vision or hearing, sinus congestion, difficulty swallowing Pulmonary: Negative; No cough, wheezing, shortness of breath, hemoptysis Cardiovascular: See HPI GI: Negative; No nausea, vomiting, diarrhea, or abdominal pain GU: Negative; No dysuria, hematuria, or difficulty voiding Musculoskeletal: Negative; no myalgias, joint pain, or weakness Hematologic/Oncology: Negative; no easy bruising, bleeding Endocrine: Negative; no heat/cold intolerance; no diabetes Neuro: Negative; no changes in balance, headaches Skin: Negative; No  rashes or skin lesions Psychiatric: Negative; No behavioral problems, depression Sleep: Positive for OSA.  History of snoring, daytime somnolence.  No bruxism.  No restless legs, no hypnagogic or hypnopompic hallucinations or cataplectic events Other comprehensive 14 point system review is negative.   PHYSICAL EXAM:   VS:  BP 124/74   Pulse 76   Ht 6' (1.829 m)   Wt 267 lb 3.2 oz (121.2 kg)   SpO2 94%   BMI 36.24 kg/m     Repeat blood pressure by me was 122/72  Wt Readings from Last 3 Encounters:  08/05/22 267 lb 3.2 oz (121.2 kg)  07/06/21 270 lb 6.4 oz (122.7 kg)  03/02/21 281 lb (127.5 kg)    General: Alert, oriented, no distress.  Skin: normal turgor, no rashes, warm and dry HEENT: Normocephalic, atraumatic. Pupils equal  round and reactive to light; sclera anicteric; extraocular muscles intact;  Nose without nasal septal hypertrophy Mouth/Parynx benign; Mallinpatti scale 3/4 Neck: No JVD, no carotid bruits; normal carotid upstroke Lungs: clear to ausculatation and percussion; no wheezing or rales Chest wall: without tenderness to palpitation Heart: PMI not displaced, RRR, s1 s2 normal, 1/6 systolic murmur, no diastolic murmur, no rubs, gallops, thrills, or heaves Abdomen: soft, nontender; no hepatosplenomehaly, BS+; abdominal aorta nontender and not dilated by palpation. Back: no CVA tenderness Pulses 2+ Musculoskeletal: full range of motion, normal strength, no joint deformities Extremities: no clubbing cyanosis or edema, Homan's sign negative  Neurologic: grossly nonfocal; Cranial nerves grossly wnl Psychologic: Normal mood and affect   Studies/Labs Reviewed:   Aug 05, 2022 ECG (independently read by me): Normal sinus rhythm at 75 bpm.  No ectopy.  Recent Labs:    Latest Ref Rng & Units 05/02/2021    2:45 PM 04/02/2017   10:32 AM 04/05/2016    9:41 AM  BMP  Glucose 70 - 99 mg/dL 161  096  90   BUN 6 - 24 mg/dL 11  14  15    Creatinine 0.76 - 1.27 mg/dL 0.45  4.09   8.11   BUN/Creat Ratio 9 - 20 13  NOT APPLICABLE    Sodium 134 - 144 mmol/L 141  138  138   Potassium 3.5 - 5.2 mmol/L 4.4  4.4  4.5   Chloride 96 - 106 mmol/L 98  102  104   CO2 20 - 29 mmol/L 26  30  27    Calcium 8.7 - 10.2 mg/dL 9.9  9.9  9.4         Latest Ref Rng & Units 04/02/2017   10:32 AM 04/05/2016    9:41 AM  Hepatic Function  Total Protein 6.1 - 8.1 g/dL 6.6  6.8   Albumin 3.6 - 5.1 g/dL  4.5   AST 10 - 35 U/L 22  17   ALT 9 - 46 U/L 43  20   Alk Phosphatase 40 - 115 U/L  39   Total Bilirubin 0.2 - 1.2 mg/dL 0.3  0.3        Latest Ref Rng & Units 05/02/2021    2:44 PM 04/02/2017   10:32 AM 04/05/2016    9:41 AM  CBC  WBC 3.4 - 10.8 x10E3/uL 9.2  6.5  6.2   Hemoglobin 13.0 - 17.7 g/dL 91.4  78.2  95.6   Hematocrit 37.5 - 51.0 % 42.2  43.6  42.3   Platelets 150 - 450 x10E3/uL 381  347  324    Lab Results  Component Value Date   MCV 87 05/02/2021   MCV 87.0 04/02/2017   MCV 90.4 04/05/2016   No results found for: "TSH" No results found for: "HGBA1C"   BNP No results found for: "BNP"  ProBNP No results found for: "PROBNP"   Lipid Panel     Component Value Date/Time   CHOL 124 04/02/2017 1032   TRIG 83 04/02/2017 1032   HDL 46 04/02/2017 1032   CHOLHDL 2.7 04/02/2017 1032   VLDL 18 07/10/2016 0925   LDLCALC 61 04/02/2017 1032     RADIOLOGY: No results found.   Additional studies/ records that were reviewed today include:   Patient Name: Arthel, Curren Date: 04/07/2021 Gender: Male D.O.B: 05/25/1965 Age (years): 29 Referring Provider: Rachelle Hora Croitoru Height (inches): 72 Interpreting Physician: Nicki Guadalajara MD, ABSM Weight (lbs): 281 RPSGT: Alfonso Ellis BMI: 38 MRN: [id number] Neck  Size: <br>   CLINICAL INFORMATION Sleep Study Type: HST   Indication for sleep study: snoring, somnolence   Epworth Sleepiness Score: 3   SLEEP STUDY TECHNIQUE A multi-channel overnight portable sleep study was performed. The channels recorded  were: nasal airflow, thoracic respiratory movement, and oxygen saturation with a pulse oximetry. Snoring was also monitored.   MEDICATIONS atorvastatin (LIPITOR) 40 MG tablet gabapentin (NEURONTIN) 400 MG capsule lisinopril-hydrochlorothiazide (ZESTORETIC) 20-12.5 MG tablet metoprolol succinate (TOPROL-XL) 50 MG 24 hr tablet varenicline (CHANTIX STARTING MONTH PAK) 0.5 MG X 11 & 1 MG X 42 tablet   Patient self administered medications include: N/A.   SLEEP ARCHITECTURE Patient was studied for 506.5 minutes. The sleep efficiency was 95.6 % and the patient was supine for 5.3%. The arousal index was 0.0 per hour.   RESPIRATORY PARAMETERS The overall AHI was 14.1 per hour, with a central apnea index of 2.4 per hour. There is a significant positional  component with supine sleep AHI 31.2/h versus non- supine sleep AHI 13.1/h.    The oxygen nadir was 80% during sleep.   CARDIAC DATA Mean heart rate during sleep was 70.1 bpm.   IMPRESSIONS - Mild obstructive sleep apnea overall (AHI 14.1/h); however, sleep apnea was severe with supine sleep (AHI 31.2/h).  - No significant central sleep apnea occurred during this study (CAI 2.4/h). - Moderate  oxygen desaturation to a nadir of  80%. - Patient snored for 364.8 minutes (72%) during the sleep.   DIAGNOSIS - Obstructive Sleep Apnea (G47.33)   RECOMMENDATIONS - Therapeutic CPAP titration to determine optimal pressure required to alleviate sleep disordered breathing. If unable for an in-lab titration initiate Auto-PAP with EPR of 3 at 6 - 16 cm of water. - Effort should be made to optimize nasal and oropharyngeal patency. - Positional therapy avoiding supine position during sleep. - Avoid alcohol, sedatives and other CNS depressants that may worsen sleep apnea and disrupt normal sleep architecture. - Sleep hygiene should be reviewed to assess factors that may improve sleep quality. - Weight management and regular exercise should be initiated  or continued. - Recommend a download and sleep clinic evaluation after one month of therapy.    ASSESSMENT:    1. OSA (obstructive sleep apnea)   2. Essential hypertension   3. PVC's (premature ventricular contractions)   4. Dyslipidemia (high LDL; low HDL)   5. Class II obesity   6. Dilated cardiomyopathy (HCC)   7. Quit using tobacco in remote past     PLAN:  Terrence Robinson is a 57 year old patient of Dr. Royann Shivers who has a history of hypertension, hyperlipidemia, obesity, frequent palpitations with monomorphic PVCs, and remote tobacco use.  After quitting smoking he had gained 30 to 60 pounds.  Due to concerns for obstructive sleep apnea with symptoms of snoring, fatigue, nonrestorative sleep, nocturia as well as daytime somnolence, he underwent a home study through a [facility] on April 06, 2021.  This was positive for obstructive sleep apnea with overall AHI of 14.1/h however he had severe sleep apnea with supine sleep with an AHI of 31.2/h.  He had significant oxygen desaturation to a nadir of 80%.  He was started on AutoPap therapy and received a new ResMed AirSense 11 AutoSet device on May 30, 2021.  Apparently, he never had a sleep evaluation and follow-up of his sleep study or initiation of CPAP therapy.  Recently, he has had issues with fatigue, poor memory and an frequent palpitations and he was referred to me  for his initial consultation and evaluation.  He apparently has been using his initial ResMed AirFit F20 mask.  He had 2 of these mass and both of these masses have split on the bottom portion.  As result, recent CPAP compliance is suboptimal due to his significant mask leak.  Since this was his initial evaluation with me today, I had a very long discussion with him and discussed normal sleep architecture and the disruptive sleep architecture associated with obstructive sleep apnea.  I reviewed in detail the effect on cardiovascular health if patients have untreated sleep  apnea with particular reference to its effects on hypertension, nocturnal arrhythmias with increased sympathetic tone contributing to palpitations as well as increased atrial fibrillation risk.  I discussed its effects on insulin resistance contributing to increased glucose, increased nocturnal GERD, as well as potential increased inflammation.  In addition we discussed nocturnal hypoxemia contributing potentially to nocturnal ischemia both cardiac as well as cerebrovascular.  I also discussed the pathophysiology associated with frequent nocturia seen in patients with untreated sleep apnea and the benefit of therapy.  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  Presently, a major problem with his compliance is his poor mask.  I provided him a new sample mask today with a ResMed AirFit F30i which I believe he will tolerate much better than his prior mask.  I discussed optimal sleep duration at 7 and 9 hours and the importance of using CPAP for the nights entirety.  I also discussed typically sleep apnea is worse during REM sleep and that preponderance of REM sleep occurs in the second half of the night.  I have made changes to his CPAP device and will increase his ramp start pressure from 4 up to 6 cm of water.  I will also change his AutoPap pressures to a range of 10 to 16 cm of water since his 95th percentile pressure is 12.9 with maximum average pressure at 14.6.  His blood pressure today is stable at 122/72 on his current regimen of lisinopril HCT and metoprolol succinate 50 mg.  He is on atorvastatin for hyperlipidemia.  He takes Nexium for GERD.  I have recommended a follow-up sleep evaluation in 6 months.  We will send this note to his DME [facility].   Medication Adjustments/Labs and Tests Ordered: Current medicines are reviewed at length with the patient today.  Concerns regarding medicines are outlined above.  Medication changes, Labs and Tests ordered today are listed in the Patient  Instructions below. Patient Instructions  Medication Instructions:  NO CHANGES  *If you need a refill on your cardiac medications before your next appointment, please call your pharmacy*    Follow-Up: At [facility], you and your health needs are our priority.  As part of our continuing mission to provide you with exceptional heart care, we have created designated Provider Care Teams.  These Care Teams include your primary Cardiologist (physician) and Advanced Practice Providers (APPs -  Physician Assistants and Nurse Practitioners) who all work together to provide you with the care you need, when you need it.  We recommend signing up for the patient portal called "MyChart".  Sign up information is provided on this After Visit Summary.  MyChart is used to connect with patients for Virtual Visits (Telemedicine).  Patients are able to view lab/test results, encounter notes, upcoming appointments, etc.  Non-urgent messages can be sent to your provider as well.   To learn more about what you can do with MyChart, go  to [email or web address].    Your next appointment:    6-8 months with Dr. Tresa Endo -- sleep clinic   Signed, Nicki Guadalajara, MD, [facility], ABSM Diplomate, American Board of Sleep Medicine  08/09/2022 8:41 AM    [facility] [address], [place], [place]  [address] Phone: [phone]

## 2022-08-05 NOTE — Patient Instructions (Addendum)
Medication Instructions:  NO CHANGES  *If you need a refill on your cardiac medications before your next appointment, please call your pharmacy*    Follow-Up: At [facility], you and your health needs are our priority.  As part of our continuing mission to provide you with exceptional heart care, we have created designated Provider Care Teams.  These Care Teams include your primary Cardiologist (physician) and Advanced Practice Providers (APPs -  Physician Assistants and Nurse Practitioners) who all work together to provide you with the care you need, when you need it.  We recommend signing up for the patient portal called "MyChart".  Sign up information is provided on this After Visit Summary.  MyChart is used to connect with patients for Virtual Visits (Telemedicine).  Patients are able to view lab/test results, encounter notes, upcoming appointments, etc.  Non-urgent messages can be sent to your provider as well.   To learn more about what you can do with MyChart, go to [email or web address].    Your next appointment:    6-8 months with Dr. Tresa Endo -- sleep clinic

## 2022-08-07 NOTE — Telephone Encounter (Signed)
Please see patient request about torn mask.  Jim Like MHA RN CCM

## 2022-08-08 NOTE — Telephone Encounter (Signed)
Patient seen by Dr. Tresa Endo 08/05/22

## 2022-08-09 ENCOUNTER — Encounter: Payer: Self-pay | Admitting: Cardiovascular Disease

## 2022-08-09 DIAGNOSIS — G4733 Obstructive sleep apnea (adult) (pediatric): Secondary | ICD-10-CM | POA: Diagnosis not present

## 2022-08-15 DIAGNOSIS — Z6836 Body mass index (BMI) 36.0-36.9, adult: Secondary | ICD-10-CM | POA: Diagnosis not present

## 2022-08-15 DIAGNOSIS — I10 Essential (primary) hypertension: Secondary | ICD-10-CM | POA: Diagnosis not present

## 2022-08-15 DIAGNOSIS — M5136 Other intervertebral disc degeneration, lumbar region: Secondary | ICD-10-CM | POA: Diagnosis not present

## 2022-08-23 DIAGNOSIS — M5442 Lumbago with sciatica, left side: Secondary | ICD-10-CM | POA: Diagnosis not present

## 2022-09-10 DIAGNOSIS — M5126 Other intervertebral disc displacement, lumbar region: Secondary | ICD-10-CM | POA: Diagnosis not present

## 2022-09-10 DIAGNOSIS — M48061 Spinal stenosis, lumbar region without neurogenic claudication: Secondary | ICD-10-CM | POA: Diagnosis not present

## 2022-09-10 DIAGNOSIS — M5442 Lumbago with sciatica, left side: Secondary | ICD-10-CM | POA: Diagnosis not present

## 2022-09-12 DIAGNOSIS — Z6835 Body mass index (BMI) 35.0-35.9, adult: Secondary | ICD-10-CM | POA: Diagnosis not present

## 2022-09-12 DIAGNOSIS — M5126 Other intervertebral disc displacement, lumbar region: Secondary | ICD-10-CM | POA: Diagnosis not present

## 2022-09-19 DIAGNOSIS — M5416 Radiculopathy, lumbar region: Secondary | ICD-10-CM | POA: Diagnosis not present

## 2022-09-19 DIAGNOSIS — M545 Low back pain, unspecified: Secondary | ICD-10-CM | POA: Diagnosis not present

## 2022-09-19 DIAGNOSIS — M2560 Stiffness of unspecified joint, not elsewhere classified: Secondary | ICD-10-CM | POA: Diagnosis not present

## 2022-09-20 ENCOUNTER — Encounter: Payer: Self-pay | Admitting: Cardiovascular Disease

## 2022-09-20 ENCOUNTER — Telehealth: Payer: Self-pay | Admitting: *Deleted

## 2022-09-20 ENCOUNTER — Ambulatory Visit: Payer: BC Managed Care – PPO | Attending: Cardiovascular Disease | Admitting: Cardiovascular Disease

## 2022-09-20 VITALS — BP 142/84 | HR 92 | Ht 72.0 in | Wt 263.2 lb

## 2022-09-20 DIAGNOSIS — E669 Obesity, unspecified: Secondary | ICD-10-CM

## 2022-09-20 DIAGNOSIS — I493 Ventricular premature depolarization: Secondary | ICD-10-CM

## 2022-09-20 DIAGNOSIS — I428 Other cardiomyopathies: Secondary | ICD-10-CM | POA: Diagnosis not present

## 2022-09-20 DIAGNOSIS — I10 Essential (primary) hypertension: Secondary | ICD-10-CM

## 2022-09-20 DIAGNOSIS — G4733 Obstructive sleep apnea (adult) (pediatric): Secondary | ICD-10-CM

## 2022-09-20 DIAGNOSIS — E1169 Type 2 diabetes mellitus with other specified complication: Secondary | ICD-10-CM

## 2022-09-20 DIAGNOSIS — Z01818 Encounter for other preprocedural examination: Secondary | ICD-10-CM

## 2022-09-20 NOTE — Telephone Encounter (Signed)
   Pre-operative Risk Assessment    Patient Name: Terrence Robinson  DOB: 08/10/1965 MRN: [id number]      Request for Surgical Clearance    Procedure:   LEFT L2-3 MICRODISCECTOMY   Date of Surgery:  Clearance 10/02/22                                 Surgeon:  Tressie Stalker MD Surgeon's Group or Practice Name:  [facility]  Phone number:  [phone]  Fax number:  [phone]    Type of Clearance Requested:   - Medical    Type of Anesthesia:  General    Additional requests/questions:    Wilhemina Cash   09/20/2022, 7:49 AM

## 2022-09-20 NOTE — Progress Notes (Signed)
Cardiology Office Note (new patient):    Date:  09/20/2022   ID:  Terrence Robinson, DOB Feb 19, 1966, MRN [id number]  PCP:  Elfredia Nevins, MD  [facility] Cardiologist:  Thurmon Fair, MD  [facility] Electrophysiologist:  None   Referring MD: Elfredia Nevins, MD   Chief Complaint  Patient presents with   Sleep Apnea   Pre-op Exam   Cardiomyopathy      History of Present Illness:    Terrence Robinson is a 57 y.o. male with a hx of essential hypertension, previous smoker, hypercholesterolemia, who was initially seen due to palpitations associated with PVCs.  Since his last appointment he has been largely very compliant with CPAP therapy.  Unfortunately he has recently developed a new herniated disc and needs to have back surgery, scheduled for July 3 with Dr. Delma Officer.  He needs new equipment after his hose was destroyed by one of his pets.  He definitely feels improvement in his energy level when he uses CPAP and has been faithful to it until the equipment problem.  Until his new herniated disc problem he was quite physically active and had no problems with shortness of breath or chest pain with activity.  He has not experienced syncope.  He does not have lower extremity edema, orthopnea or PND.  He quit smoking successfully, but subsequently gained 30 pounds in weight (gained a total of 60 pounds in the last 4 years).  He is now in severe obesity range with a BMI over 35.  Has a big weakness for bread and potatoes.  We have had the discussion regarding the glycemic index and the adverse impact of sweets and easily digestible starches on his metabolic profile.  His most recent hemoglobin A1c was fully in diabetes range at 6.7% (he is not yet on any medications for his glucose levels).  Recent labs showed a satisfactory reduction in LDL cholesterol to 76, close to our target range.  A Kardiamobile transmission that he submitted for chest pressure showed extremely frequent  monomorphic PVCs.  On several previous ECG tracings in the office he had frequent monomorphic PVCs that are probably of LVOT origin (RBBB morphology in lead V1, monophasic in the limb leads with inferior axis, transition lead in V2).  On exam today he does not have any PVCs and we did not catch any ectopy on the twelve-lead ECG either.  He is on metoprolol succinate.  Echocardiogram in January 2023 showed mild dilation of both the right and left ventricles, but with preserved systolic function.  No significant valve problems are seen.  Please continue to keep him n.p.o.  Cardiac MRI performed in February 2023 showed normal left and right ventricular systolic function (LVEF 56%, RVEF 53%) and no evidence of late gadolinium enhancement.  Past Medical History:  Diagnosis Date   Degenerative joint disease (DJD) of lumbar spine    back DJD   Essential hypertension    Generalized anxiety disorder    GERD (gastroesophageal reflux disease)    HLD (hyperlipidemia) 04/12/2016   Hypertension    Neuromuscular disorder (HCC)    sciatica   OSA (obstructive sleep apnea) 07/06/2021   Pneumonia    Tobacco abuse 04/05/2016    Past Surgical History:  Procedure Laterality Date   AMPUTATION  2003   finger left hand   BACK SURGERY     x 3   CARPAL TUNNEL RELEASE Right    SPINE SURGERY     2001, 2010, 2012  Current Medications: Current Meds  Medication Sig   atorvastatin (LIPITOR) 40 MG tablet Take 1 tablet (40 mg total) by mouth daily.   esomeprazole (NEXIUM) 40 MG capsule Take 40 mg by mouth daily.   lisinopril-hydrochlorothiazide (ZESTORETIC) 20-12.5 MG tablet Take 2 tablets by mouth daily.   metoprolol succinate (TOPROL-XL) 50 MG 24 hr tablet TAKE 1 TABLET BY MOUTH DAILY. TAKE WITH OR IMMEDIATELY FOLLOWING A MEAL.   PERCOCET 5-325 MG tablet Take 1 tablet by mouth every 4 (four) hours as needed for severe pain.   tiZANidine (ZANAFLEX) 4 MG tablet Take 4 mg by mouth every 6 (six) hours as needed for  muscle spasms.     Allergies:   Erythromycin   Social History   Socioeconomic History   Marital status: Married    Spouse name: wendy   Number of children: 2   Years of education: 12   Highest education level: Not on file  Occupational History   Occupation: Set designer  Tobacco Use   Smoking status: Former    Packs/day: 1    Types: Cigarettes    Start date: 04/01/1986    Quit date: 07/30/2019    Years since quitting: 3.1   Smokeless tobacco: Never  Substance and Sexual Activity   Alcohol use: No   Drug use: No   Sexual activity: Yes    Birth control/protection: Post-menopausal  Other Topics Concern   Not on file  Social History Narrative   Lives with wife Toniann Fail    one son-  Fransisco Beau in college   Maintenance at [employer]    Social Determinants of Health   Financial Resource Strain: Not on file  Food Insecurity: Not on file  Transportation Needs: Not on file  Physical Activity: Not on file  Stress: Not on file  Social Connections: Not on file     Family History: The patient's family history includes Arthritis in his father; Cancer in his father and paternal grandfather; Diabetes in his paternal grandmother; Heart disease in his father; Hyperlipidemia in his brother; Hypertension in his father; Kidney disease in his father.  ROS:   Please see the history of present illness.    All other systems reviewed and are negative.  EKGs/Labs/Other Studies Reviewed:     Echocardiogram 04/11/2021   1. Left ventricular ejection fraction, by estimation, is 55 to 60%. The left ventricle has normal function. The left ventricle has no regional wall motion abnormalities. The left ventricular internal cavity size was mildly dilated. There is mild left ventricular hypertrophy. Left ventricular diastolic parameters were normal.   2. Right ventricular systolic function is normal. The right ventricular size is mildly enlarged. Tricuspid regurgitation signal is inadequate for assessing  PA pressure.   3. The mitral valve is normal in structure. No evidence of mitral valve regurgitation. No evidence of mitral stenosis.   4. The aortic valve is tricuspid. Aortic valve regurgitation is not visualized. No aortic stenosis is present.   5. The inferior vena cava is normal in size with greater than 50% respiratory variability, suggesting right atrial pressure of 3 mmHg.   Cardiac MRI 05/09/2021: 1. The left ventricle is mildly dilated with normal wall thickness.EF 56% with normal wall motion.   2.  Normal RV size and systolic function, EF 53%.   3. No myocardial LGE, so no definitive evidence for prior MI, infiltrative disease, or myocarditis.   4.  Normal T1 indices.  EKG:  EKG is  ordered today.  It shows normal sinus rhythm  is a normal tracing.  No PVCs are seen.  Recent Labs: No results found for requested labs within last 365 days.  05/10/2019 Hemoglobin 15.1, creatinine 0.79, potassium 4.4, normal liver function tests, TSH 0.795 11/10/2018 Hemoglobin A1c 5.8% 05/25/2020 Creatinine 0.69, potassium 4.4, hemoglobin 15.1 ALT 52, TSH 1.99, Hemoglobin A1c 6.0%  Recent Lipid Panel    Component Value Date/Time   CHOL 124 04/02/2017 1032   TRIG 83 04/02/2017 1032   HDL 46 04/02/2017 1032   CHOLHDL 2.7 04/02/2017 1032   VLDL 18 07/10/2016 0925   LDLCALC 61 04/02/2017 1032  05/10/2019 Total cholesterol 107, HDL 43, LDL 50, triglycerides 61 05/25/2020 Cholesterol 124, HDL 45, LDL 63, triglycerides 80  Physical Exam:    VS:  BP (!) 142/84 (BP Location: Left Arm, Patient Position: Sitting, Cuff Size: Large)   Pulse 92   Ht 6' (1.829 m)   Wt 263 lb 3.2 oz (119.4 kg)   SpO2 97%   BMI 35.70 kg/m     Wt Readings from Last 3 Encounters:  09/20/22 263 lb 3.2 oz (119.4 kg)  08/05/22 267 lb 3.2 oz (121.2 kg)  07/06/21 270 lb 6.4 oz (122.7 kg)      General: Alert, oriented x3, no distress, severely obese Head: no evidence of trauma, PERRL, EOMI, no exophtalmos or  lid lag, no myxedema, no xanthelasma; normal ears, nose and oropharynx Neck: normal jugular venous pulsations and no hepatojugular reflux; brisk carotid pulses without delay and no carotid bruits Chest: clear to auscultation, no signs of consolidation by percussion or palpation, normal fremitus, symmetrical and full respiratory excursions Cardiovascular: normal position and quality of the apical impulse, regular rhythm, normal first and second heart sounds, no murmurs, rubs or gallops Abdomen: no tenderness or distention, no masses by palpation, no abnormal pulsatility or arterial bruits, normal bowel sounds, no hepatosplenomegaly Extremities: no clubbing, cyanosis or edema; 2+ radial, ulnar and brachial pulses bilaterally; 2+ right femoral, posterior tibial and dorsalis pedis pulses; 2+ left femoral, posterior tibial and dorsalis pedis pulses; no subclavian or femoral bruits Neurological: grossly nonfocal Psych: Normal mood and affect      ASSESSMENT:    1. Preoperative clearance   2. Nonischemic cardiomyopathy (HCC)   3. PVC's (premature ventricular contractions)   4. OSA (obstructive sleep apnea)   5. Essential hypertension   6. Type 2 diabetes mellitus with obesity (HCC)   7. Severe obesity (BMI 35.0-39.9) with comorbidity (HCC)       PLAN:    In order of problems listed above:  Preoperative cardiovascular exam: I think he is at low risk for major cardiovascular complications with the planned back surgery.  Important not to discontinue his beta-blocker and pertinently in the perioperative period.   Cardiomyopathy: During workup for his PVCs we discover that he has a dilated left ventricle but he has normal left ventricular systolic function.  This time to reevaluate his echocardiogram, but this does not need to be performed necessarily before he has his back surgery.  He did not have any evidence of scar/late gadolinium enhancement on his Cardiac MRI.  He is on a beta-blocker and  ACE inhibitor.  He has no clear findings to support a diagnosis of congestive heart failure.  Note that diastolic function parameters were also normal.  Plan to repeat an echocardiogram in a year.  He has not had a formal ischemic work-up, but does not really have symptoms compatible with angina pectoris.  Imaging studies are not compatible with ischemic cardiomyopathy.  The focus is on risk factor modification. PVCs: None were seen on his ECG tracing today and I did not hear any ectopy during his exam.  Unsure whether these are a consequence or cause of cardiomyopathy.  He has occasional episodes of chest discomfort at rest, never associated with physical activity, appear to correlate with increased frequency of PVCs.  The PVCs appear to have LVOT origin.  Unlikely to tolerate higher doses of beta-blocker.  Consider referral to EP to discuss ablation or treatment with true antiarrhythmic in the future, if they continue to be a problem..   OSA: He has definitely noticed improvement from use of CPAP.  He was compliant with CPAP therapy 100% of the time until damage to his equipment from one of his pets.  We need to get him new equipment. HTN: Typically well-controlled.  He is having quite a bit of back pain today which is why he thinks his blood pressure is a little high.  No changes were made to his medications. HLP: LDL is great, but low HDL will not improve without substantial weight loss. DM: He managed to improve his hemoglobin A1c with diet and exercise last year, but now this has deteriorated again and he is in full diabetes range. Obesity: Severely obese.  Reviewed the importance of exercise after he fixes the back problem.  Again reviewed the glycemic index and a healthy diet. History of smoking, 40 pack years:.  He does not have any clear symptoms of COPD.  Congratulated him on avoiding smoking, even though associated with weight gain.   Medication Adjustments/Labs and Tests Ordered: Current  medicines are reviewed at length with the patient today.  Concerns regarding medicines are outlined above.  Orders Placed This Encounter  Procedures   EKG 12-Lead   ECHOCARDIOGRAM COMPLETE     No orders of the defined types were placed in this encounter.    Patient Instructions  Medication Instructions:  No changes *If you need a refill on your cardiac medications before your next appointment, please call your pharmacy*  Testing/Procedures: Your physician has requested that you have an echocardiogram in Early August. Echocardiography is a painless test that uses sound waves to create images of your heart. It provides your doctor with information about the size and shape of your heart and how well your heart's chambers and valves are working. This procedure takes approximately one hour. There are no restrictions for this procedure. Please do NOT wear cologne, perfume, aftershave, or lotions (deodorant is allowed). Please arrive 15 minutes prior to your appointment time.    Follow-Up: At [facility], you and your health needs are our priority.  As part of our continuing mission to provide you with exceptional heart care, we have created designated Provider Care Teams.  These Care Teams include your primary Cardiologist (physician) and Advanced Practice Providers (APPs -  Physician Assistants and Nurse Practitioners) who all work together to provide you with the care you need, when you need it.  We recommend signing up for the patient portal called "MyChart".  Sign up information is provided on this After Visit Summary.  MyChart is used to connect with patients for Virtual Visits (Telemedicine).  Patients are able to view lab/test results, encounter notes, upcoming appointments, etc.  Non-urgent messages can be sent to your provider as well.   To learn more about what you can do with MyChart, go to [email or web address].    Your next appointment:   1 year(s)  Provider:  Thurmon Fair, MD        Signed, Thurmon Fair, MD  09/20/2022 4:29 PM    [facility]

## 2022-09-20 NOTE — Patient Instructions (Addendum)
Medication Instructions:  No changes *If you need a refill on your cardiac medications before your next appointment, please call your pharmacy*  Testing/Procedures: Your physician has requested that you have an echocardiogram in Early August. Echocardiography is a painless test that uses sound waves to create images of your heart. It provides your doctor with information about the size and shape of your heart and how well your heart's chambers and valves are working. This procedure takes approximately one hour. There are no restrictions for this procedure. Please do NOT wear cologne, perfume, aftershave, or lotions (deodorant is allowed). Please arrive 15 minutes prior to your appointment time.    Follow-Up: At [facility], you and your health needs are our priority.  As part of our continuing mission to provide you with exceptional heart care, we have created designated Provider Care Teams.  These Care Teams include your primary Cardiologist (physician) and Advanced Practice Providers (APPs -  Physician Assistants and Nurse Practitioners) who all work together to provide you with the care you need, when you need it.  We recommend signing up for the patient portal called "MyChart".  Sign up information is provided on this After Visit Summary.  MyChart is used to connect with patients for Virtual Visits (Telemedicine).  Patients are able to view lab/test results, encounter notes, upcoming appointments, etc.  Non-urgent messages can be sent to your provider as well.   To learn more about what you can do with MyChart, go to [email or web address].    Your next appointment:   1 year(s)  Provider:   Thurmon Fair, MD

## 2022-09-20 NOTE — Telephone Encounter (Signed)
NOTES HAVE BEEN FAXED TO DR. Lovell Sheehan

## 2022-09-20 NOTE — Telephone Encounter (Signed)
Preoperative team, Dr. Royann Shivers is seeing patient in clinic today.  He is in the process of finishing his note.  Once his note has been finished we please forward his note to requesting office.  Thank you for your help.  I will remove patient from preoperative pool.  Terrence Robinson. Baljit Liebert NP-C     09/20/2022, 10:12 AM [facility] [address] Office [phone] Fax [phone]

## 2022-10-02 DIAGNOSIS — I10 Essential (primary) hypertension: Secondary | ICD-10-CM | POA: Diagnosis not present

## 2022-10-02 DIAGNOSIS — M5126 Other intervertebral disc displacement, lumbar region: Secondary | ICD-10-CM | POA: Diagnosis not present

## 2022-10-02 DIAGNOSIS — Z9889 Other specified postprocedural states: Secondary | ICD-10-CM | POA: Diagnosis not present

## 2022-10-02 DIAGNOSIS — M5116 Intervertebral disc disorders with radiculopathy, lumbar region: Secondary | ICD-10-CM | POA: Diagnosis not present

## 2022-11-06 ENCOUNTER — Ambulatory Visit (HOSPITAL_COMMUNITY): Payer: BC Managed Care – PPO | Attending: Cardiovascular Disease

## 2022-11-06 DIAGNOSIS — I428 Other cardiomyopathies: Secondary | ICD-10-CM | POA: Diagnosis not present

## 2022-11-06 DIAGNOSIS — I351 Nonrheumatic aortic (valve) insufficiency: Secondary | ICD-10-CM | POA: Diagnosis not present

## 2022-11-06 LAB — ECHOCARDIOGRAM COMPLETE
Area-P 1/2: 4.36 cm2
Calc EF: 55.8 %
P 1/2 time: 485 msec
S' Lateral: 2.8 cm
Single Plane A2C EF: 55.7 %
Single Plane A4C EF: 54 %

## 2022-12-24 DIAGNOSIS — M5126 Other intervertebral disc displacement, lumbar region: Secondary | ICD-10-CM | POA: Diagnosis not present

## 2022-12-30 ENCOUNTER — Ambulatory Visit: Payer: BC Managed Care – PPO | Admitting: Cardiovascular Disease

## 2023-01-14 NOTE — Addendum Note (Signed)
Addended by: Brunetta Genera on: 01/14/2023 11:06 AM   Modules accepted: Orders

## 2023-02-20 ENCOUNTER — Encounter: Payer: Self-pay | Admitting: Cardiovascular Disease

## 2023-02-20 ENCOUNTER — Ambulatory Visit: Payer: BC Managed Care – PPO | Attending: Cardiovascular Disease | Admitting: Cardiovascular Disease

## 2023-02-20 VITALS — BP 136/88 | HR 59 | Ht 72.0 in | Wt 281.0 lb

## 2023-02-20 DIAGNOSIS — E785 Hyperlipidemia, unspecified: Secondary | ICD-10-CM

## 2023-02-20 DIAGNOSIS — I10 Essential (primary) hypertension: Secondary | ICD-10-CM | POA: Diagnosis not present

## 2023-02-20 DIAGNOSIS — I493 Ventricular premature depolarization: Secondary | ICD-10-CM

## 2023-02-20 DIAGNOSIS — I420 Dilated cardiomyopathy: Secondary | ICD-10-CM | POA: Diagnosis not present

## 2023-02-20 DIAGNOSIS — G4733 Obstructive sleep apnea (adult) (pediatric): Secondary | ICD-10-CM | POA: Diagnosis not present

## 2023-02-20 NOTE — Progress Notes (Signed)
Cardiology Office Note    Date:  03/02/2023   ID:  Terrence Robinson, DOB [birth year], MRN [id number]  PCP:  Elfredia Nevins, MD  Cardiologist:  Nicki Guadalajara, MD (sleep); Dr. Royann Shivers  80-month follow-up sleep evaluation initially referred by Dr. Royann Shivers   History of Present Illness:  Terrence Robinson is a 57 y.o. male who is followed by Dr. Royann Shivers for cardiology care and sees Dr. Sherwood Gambler for primary care.  I saw him for my initial sleep consultation and evaluation on Aug 05, 2022.  He presents for 85-month follow-up evaluation.  Terrence Robinson has a history of hypertension, hyperlipidemia, frequent PVCs, and had remote tobacco history.  After quitting smoking, he had gained approximately 60 pounds.  He has been documented to have frequent monomorphic PVCs felt to be LVOT origin.  An echo Doppler study in January 2023 showed normal LV function with EF 55 to 60% with mild LVH and mild dilation of both LV and RV.  Due to concerns for obstructive sleep apnea with symptoms of snoring, fatigue, nonrestorative sleep, nocturia and somnolence, he underwent a home sleep study.  This revealed mild overall sleep apnea with an AHI of 14.1/h.  There was a significant positional component with supine sleep AHI at 31.2/h. Therapeutic CPAP was recommended and apparently he received AutoPap therapy with a ResMed AirSense 11 AutoSet unit with set up date on May 30, 2021 at initial pressure range of 6 to 16 cm of water.  Apparently, the patient never had a sleep evaluation.  He had obtained his device through [facility] in [place].  Apparently, when he recently saw Dr. Royann Shivers on July 06, 2021 he was concerned that potential untreated sleep apnea may explain some of his complaints of hypersomnia, fatigue, poor memory and frequent palpitations.  Sleep evaluation was recommended and he presents today for his initial sleep consultation and evaluation with me.  I obtained a download from the patient's CPAP machine  from April 4 through Aug 02, 2022.  Compliance is suboptimal with only 50% of usage days and average use at 5 hours and 52 minutes.  Apparently he was having significant leak in his mask leading to recent not using his equipment.  He had an F20 mask and there was significant breakdown of 2 of these masks.  At his pressure set range at 6 to 16 cm of water, AHI is 2.1.  His 95th percentile pressure is 12.9 with maximum average pressure at 14.5.  He denies any significant daytime sleepiness.  He works the night shift and typically goes to bed at 9:30 AM and wakes up at 5 PM.  An Epworth Sleepiness Scale score was calculated in the office today and this endorsed at 2.  During his initial evaluation I had a lengthy discussion with him regarding normal sleep architecture and the potential disruptive effects of untreated sleep apnea.  In addition I extensively reviewed potential adverse cardiovascular consequences of untreated sleep apnea.  During that evaluation, I made slight adjustment to his CPAP device and we increased his ramp start pressure up to 6 cm of water and change his AutoPap pressure to a range of 10 to 16 cm of water.  Since I last saw him, Mr. [name] has continued to use CPAP therapy.  [facility] is his DME company.  His sleep is difficult and that he often works 12-hour night shift from 7 PM until 7 AM on Friday Saturday and Sunday evenings, sleeps on Monday, and then shifts his  work time on Monday Tuesday Wednesday and Thursday at which time he then sleeps at night.  Undoubtably this has been disruptive.  His most recent download however still shows compliance with usage days and 80% and average use at 6 hours 25 minutes.  At his pressure range of 10 to 16 cm of water, AHI is 1.8 and his 95th percentile pressure is 14.1 with maximum average pressure 14.8.  An Epworth scale was calculated the office today and this endorsed at 2 argue against residual daytime sleepiness.  He presents for a  follow-up evaluation.   Past Medical History:  Diagnosis Date   Degenerative joint disease (DJD) of lumbar spine    back DJD   Essential hypertension    Generalized anxiety disorder    GERD (gastroesophageal reflux disease)    HLD (hyperlipidemia) 04/12/2016   Hypertension    Neuromuscular disorder (HCC)    sciatica   OSA (obstructive sleep apnea) 07/06/2021   Pneumonia    Tobacco abuse 04/05/2016    Past Surgical History:  Procedure Laterality Date   AMPUTATION  2003   finger left hand   BACK SURGERY     x 3   CARPAL TUNNEL RELEASE Right    SPINE SURGERY     2001, 2010, 2012    Current Medications: Outpatient Medications Prior to Visit  Medication Sig Dispense Refill   atorvastatin (LIPITOR) 40 MG tablet Take 1 tablet (40 mg total) by mouth daily. 90 tablet 3   esomeprazole (NEXIUM) 40 MG capsule Take 40 mg by mouth daily.     lisinopril-hydrochlorothiazide (ZESTORETIC) 20-12.5 MG tablet Take 2 tablets by mouth daily. 180 tablet 3   metoprolol succinate (TOPROL-XL) 50 MG 24 hr tablet TAKE 1 TABLET BY MOUTH DAILY. TAKE WITH OR IMMEDIATELY FOLLOWING A MEAL. 90 tablet 2   PERCOCET 5-325 MG tablet Take 1 tablet by mouth every 4 (four) hours as needed for severe pain.     tiZANidine (ZANAFLEX) 4 MG tablet Take 4 mg by mouth every 6 (six) hours as needed for muscle spasms.     No facility-administered medications prior to visit.     Allergies:   Erythromycin   Social History   Socioeconomic History   Marital status: Married    Spouse name: wendy   Number of children: 2   Years of education: 12   Highest education level: Not on file  Occupational History   Occupation: Set designer  Tobacco Use   Smoking status: Former    Current packs/day: 0.00    Average packs/day: 1 pack/day for 33.3 years (33.3 ttl pk-yrs)    Types: Cigarettes    Start date: 04/01/1986    Quit date: 07/30/2019    Years since quitting: 3.5   Smokeless tobacco: Never  Substance and Sexual Activity    Alcohol use: No   Drug use: No   Sexual activity: Yes    Birth control/protection: Post-menopausal  Other Topics Concern   Not on file  Social History Narrative   Lives with wife Toniann Fail    one son-  Fransisco Beau in college   Maintenance at [employer]    Social Determinants of Health   Financial Resource Strain: Not on file  Food Insecurity: Not on file  Transportation Needs: Not on file  Physical Activity: Not on file  Stress: Not on file  Social Connections: Unknown (08/14/2021)   Received from [facility], [facility]   Social Network    Social Network: Not on file  Socially, he was born in [place].  He is married for 32 years.  He has 2 children ages 36 and 69.  He works at [employer] in [employer] in [place].  Family History:  The patient's family history includes Arthritis in his father; Cancer in his father and paternal grandfather; Diabetes in his paternal grandmother; Heart disease in his father; Hyperlipidemia in his brother; Hypertension in his father; Kidney disease in his father.  His father died at age 19.  Mother unfortunately died at age 46 in a car accident.  He has 1 brother.  ROS General: Negative; No fevers, chills, or night sweats;  HEENT: Negative; No changes in vision or hearing, sinus congestion, difficulty swallowing Pulmonary: Negative; No cough, wheezing, shortness of breath, hemoptysis Cardiovascular: See HPI GI: Negative; No nausea, vomiting, diarrhea, or abdominal pain GU: Negative; No dysuria, hematuria, or difficulty voiding Musculoskeletal: Negative; no myalgias, joint pain, or weakness Hematologic/Oncology: Negative; no easy bruising, bleeding Endocrine: Negative; no heat/cold intolerance; no diabetes Neuro: Negative; no changes in balance, headaches Skin: Negative; No rashes or skin lesions Psychiatric: Negative; No behavioral problems, depression Sleep: Positive for OSA.  History of snoring, daytime somnolence.  No bruxism.  No  restless legs, no hypnagogic or hypnopompic hallucinations or cataplectic events Other comprehensive 14 point system review is negative.   PHYSICAL EXAM:   VS:  BP 136/88   Pulse (!) 59   Ht 6' (1.829 m)   Wt 281 lb (127.5 kg)   SpO2 98%   BMI 38.11 kg/m     Repeat blood pressure by me was 126 at 82  Wt Readings from Last 3 Encounters:  02/20/23 281 lb (127.5 kg)  09/20/22 263 lb 3.2 oz (119.4 kg)  08/05/22 267 lb 3.2 oz (121.2 kg)    General: Alert, oriented, no distress.  Skin: normal turgor, no rashes, warm and dry HEENT: Normocephalic, atraumatic. Pupils equal round and reactive to light; sclera anicteric; extraocular muscles intact;  Nose without nasal septal hypertrophy Mouth/Parynx benign; Mallinpatti scale 3/4 Neck: No JVD, no carotid bruits; normal carotid upstroke Lungs: clear to ausculatation and percussion; no wheezing or rales Chest wall: without tenderness to palpitation Heart: PMI not displaced, RRR, s1 s2 normal, 1/6 systolic murmur, no diastolic murmur, no rubs, gallops, thrills, or heaves Abdomen: soft, nontender; no hepatosplenomehaly, BS+; abdominal aorta nontender and not dilated by palpation. Back: no CVA tenderness Pulses 2+ Musculoskeletal: full range of motion, normal strength, no joint deformities Extremities: no clubbing cyanosis or edema, Homan's sign negative  Neurologic: grossly nonfocal; Cranial nerves grossly wnl Psychologic: Normal mood and affect    Studies/Labs Reviewed:   EKG Interpretation Date/Time:  Thursday February 20 2023 12:51:08 EST Ventricular Rate:  59 PR Interval:  154 QRS Duration:  102 QT Interval:  374 QTC Calculation: 370 R Axis:   85  Text Interpretation: Sinus bradycardia When compared with ECG of 20-Sep-2022 09:08, Vent. rate has decreased BY  33 BPM Questionable change in QRS axis QT has shortened Confirmed by Nicki Guadalajara ([id number]) on 02/20/2023 1:20:55 PM    Aug 05, 2022 ECG (independently read by me): Normal  sinus rhythm at 75 bpm.  No ectopy.  Recent Labs:    Latest Ref Rng & Units 05/02/2021    2:45 PM 04/02/2017   10:32 AM 04/05/2016    9:41 AM  BMP  Glucose 70 - 99 mg/dL 191  478  90   BUN 6 - 24 mg/dL 11  14  15    Creatinine 0.76 -  1.27 mg/dL 1.61  0.96  0.45   BUN/Creat Ratio 9 - 20 13  NOT APPLICABLE    Sodium 134 - 144 mmol/L 141  138  138   Potassium 3.5 - 5.2 mmol/L 4.4  4.4  4.5   Chloride 96 - 106 mmol/L 98  102  104   CO2 20 - 29 mmol/L 26  30  27    Calcium 8.7 - 10.2 mg/dL 9.9  9.9  9.4         Latest Ref Rng & Units 04/02/2017   10:32 AM 04/05/2016    9:41 AM  Hepatic Function  Total Protein 6.1 - 8.1 g/dL 6.6  6.8   Albumin 3.6 - 5.1 g/dL  4.5   AST 10 - 35 U/L 22  17   ALT 9 - 46 U/L 43  20   Alk Phosphatase 40 - 115 U/L  39   Total Bilirubin 0.2 - 1.2 mg/dL 0.3  0.3        Latest Ref Rng & Units 05/02/2021    2:44 PM 04/02/2017   10:32 AM 04/05/2016    9:41 AM  CBC  WBC 3.4 - 10.8 x10E3/uL 9.2  6.5  6.2   Hemoglobin 13.0 - 17.7 g/dL 40.9  81.1  91.4   Hematocrit 37.5 - 51.0 % 42.2  43.6  42.3   Platelets 150 - 450 x10E3/uL 381  347  324    Lab Results  Component Value Date   MCV 87 05/02/2021   MCV 87.0 04/02/2017   MCV 90.4 04/05/2016   No results found for: "TSH" No results found for: "HGBA1C"   BNP No results found for: "BNP"  ProBNP No results found for: "PROBNP"   Lipid Panel     Component Value Date/Time   CHOL 124 04/02/2017 1032   TRIG 83 04/02/2017 1032   HDL 46 04/02/2017 1032   CHOLHDL 2.7 04/02/2017 1032   VLDL 18 07/10/2016 0925   LDLCALC 61 04/02/2017 1032     RADIOLOGY: No results found.   Additional studies/ records that were reviewed today include:   Patient Name: Paulie, Astin Date: 04/07/2021 Gender: Male D.O.B: 1966/01/08 Age (years): 13 Referring Provider: Rachelle Hora Croitoru Height (inches): 72 Interpreting Physician: Nicki Guadalajara MD, ABSM Weight (lbs): 281 RPSGT: Alfonso Ellis BMI: 38 MRN:  [id number] Neck Size: <br>   CLINICAL INFORMATION Sleep Study Type: HST   Indication for sleep study: snoring, somnolence   Epworth Sleepiness Score: 3   SLEEP STUDY TECHNIQUE A multi-channel overnight portable sleep study was performed. The channels recorded were: nasal airflow, thoracic respiratory movement, and oxygen saturation with a pulse oximetry. Snoring was also monitored.   MEDICATIONS atorvastatin (LIPITOR) 40 MG tablet gabapentin (NEURONTIN) 400 MG capsule lisinopril-hydrochlorothiazide (ZESTORETIC) 20-12.5 MG tablet metoprolol succinate (TOPROL-XL) 50 MG 24 hr tablet varenicline (CHANTIX STARTING MONTH PAK) 0.5 MG X 11 & 1 MG X 42 tablet   Patient self administered medications include: N/A.   SLEEP ARCHITECTURE Patient was studied for 506.5 minutes. The sleep efficiency was 95.6 % and the patient was supine for 5.3%. The arousal index was 0.0 per hour.   RESPIRATORY PARAMETERS The overall AHI was 14.1 per hour, with a central apnea index of 2.4 per hour. There is a significant positional  component with supine sleep AHI 31.2/h versus non- supine sleep AHI 13.1/h.    The oxygen nadir was 80% during sleep.   CARDIAC DATA Mean heart rate during sleep was 70.1 bpm.   IMPRESSIONS -  Mild obstructive sleep apnea overall (AHI 14.1/h); however, sleep apnea was severe with supine sleep (AHI 31.2/h).  - No significant central sleep apnea occurred during this study (CAI 2.4/h). - Moderate  oxygen desaturation to a nadir of  80%. - Patient snored for 364.8 minutes (72%) during the sleep.   DIAGNOSIS - Obstructive Sleep Apnea (G47.33)   RECOMMENDATIONS - Therapeutic CPAP titration to determine optimal pressure required to alleviate sleep disordered breathing. If unable for an in-lab titration initiate Auto-PAP with EPR of 3 at 6 - 16 cm of water. - Effort should be made to optimize nasal and oropharyngeal patency. - Positional therapy avoiding supine position during  sleep. - Avoid alcohol, sedatives and other CNS depressants that may worsen sleep apnea and disrupt normal sleep architecture. - Sleep hygiene should be reviewed to assess factors that may improve sleep quality. - Weight management and regular exercise should be initiated or continued. - Recommend a download and sleep clinic evaluation after one month of therapy.    ASSESSMENT:    1. OSA (obstructive sleep apnea)   2. Essential hypertension   3. Dilated cardiomyopathy (HCC)   4. Dyslipidemia (high LDL; low HDL)   5. PVC's (premature ventricular contractions)     PLAN:  Mr. Trieu Korey is a 57 year old patient of Dr. Royann Shivers who has a history of hypertension, hyperlipidemia, obesity, frequent palpitations with monomorphic PVCs, and remote tobacco use.  After quitting smoking he had gained 30 to 60 pounds.  Due to concerns for obstructive sleep apnea with symptoms of snoring, fatigue, nonrestorative sleep, nocturia as well as daytime somnolence, he underwent a home study at [facility] on April 06, 2021.  This was positive for obstructive sleep apnea with overall AHI of 14.1/h however he had severe sleep apnea with supine sleep with an AHI of 31.2/h.  He had significant oxygen desaturation to a nadir of 80%.  He was started on AutoPap therapy and received a new ResMed AirSense 11 AutoSet device on May 30, 2021.  Apparently, he never had a sleep evaluation and follow-up of his sleep study or initiation of CPAP therapy.  I saw him for my initial sleep consultation on Aug 05, 2022 when he was referred to me with fatigue, poor memory and frequent palpitations.  At the time he was using his initial ResMed AirFit F20 mask.  His masks were split with considerable potential leak.  At his initial sleep evaluation I had a very long discussion with him regarding normal sleep architecture and its disruptive effects if sleep apnea is untreated.  I reviewed adverse cardiovascular consequences of  untreated sleep apnea particularly with reference to its effects on hypertension, nocturnal arrhythmias with increased sympathetic tone contribute to palpitations as well as increased atrial fibrillation risk.  In addition I discussed effects on insulin resistance contributing to increased glucose, potential increased inflammation as well as increased nocturnal GERD.  I discussed potential nocturnal hypoxemia contributing to nocturnal ischemia particularly if there is underlying coronary or cerebrovascular disease.  During that initial evaluation I provided him with a new ResMed AirFit F30i mask which I felt would be improved.  Currently, he is doing remarkably well considering he has very disruptive sleep schedule and that 3 days a week he works the night shift from 7 PM until 7 AM and 4 days/week he works the day shift and typically sleeps mostly on Monday.  At his pressure setting of 10 to 16 cm, AHI is 1.8.  With his 95th percentile pressure  being 14.1 I will change his AutoSet to a range of 12 to 18 cm of water.  He continues to use [facility] where he obtains supplies.  His blood pressure today is stable on current therapy with lisinopril HCT and Toprol X.  He is on atorvastatin for hyperlipidemia and Nexium for GERD.  He will return to the cardiology care of Dr. Royann Shivers. I will see him from a sleep perspective until I retire and then he will need to be transitioned for future sleep care.    Medication Adjustments/Labs and Tests Ordered: Current medicines are reviewed at length with the patient today.  Concerns regarding medicines are outlined above.  Medication changes, Labs and Tests ordered today are listed in the Patient Instructions below. Patient Instructions  Medication Instructions:  No medication changes were made during today's visit  *If you need a refill on your cardiac medications before your next appointment, please call your pharmacy*   Lab Work: No labs were order during  today's visit.  If you have labs (blood work) drawn today and your tests are completely normal, you will receive your results only by: MyChart Message (if you have MyChart) OR A paper copy in the mail If you have any lab test that is abnormal or we need to change your treatment, we will call you to review the results.   Testing/Procedures: No procedures ordered today.    Follow-Up: At [facility], you and your health needs are our priority.  As part of our continuing mission to provide you with exceptional heart care, we have created designated Provider Care Teams.  These Care Teams include your primary Cardiologist (physician) and Advanced Practice Providers (APPs -  Physician Assistants and Nurse Practitioners) who all work together to provide you with the care you need, when you need it.  We recommend signing up for the patient portal called "MyChart".  Sign up information is provided on this After Visit Summary.  MyChart is used to connect with patients for Virtual Visits (Telemedicine).  Patients are able to view lab/test results, encounter notes, upcoming appointments, etc.  Non-urgent messages can be sent to your provider as well.   To learn more about what you can do with MyChart, go to [email or web address].    Your next appointment:    As needed for sleep concerns  Provider:   Dr Tresa Endo    Other Instructions If you have any questions or concerns regarding your c-pap, bi-pap or sleep accessories, please contact Brandie Rorie at [phone].       Signed, Nicki Guadalajara, MD, [facility], ABSM Diplomate, American Board of Sleep Medicine  03/02/2023 1:46 PM    [facility] [address], [place], [place]  [address] Phone: [phone]

## 2023-02-20 NOTE — Patient Instructions (Signed)
Medication Instructions:  No medication changes were made during today's visit  *If you need a refill on your cardiac medications before your next appointment, please call your pharmacy*   Lab Work: No labs were order during today's visit.  If you have labs (blood work) drawn today and your tests are completely normal, you will receive your results only by: MyChart Message (if you have MyChart) OR A paper copy in the mail If you have any lab test that is abnormal or we need to change your treatment, we will call you to review the results.   Testing/Procedures: No procedures ordered today.    Follow-Up: At [facility], you and your health needs are our priority.  As part of our continuing mission to provide you with exceptional heart care, we have created designated Provider Care Teams.  These Care Teams include your primary Cardiologist (physician) and Advanced Practice Providers (APPs -  Physician Assistants and Nurse Practitioners) who all work together to provide you with the care you need, when you need it.  We recommend signing up for the patient portal called "MyChart".  Sign up information is provided on this After Visit Summary.  MyChart is used to connect with patients for Virtual Visits (Telemedicine).  Patients are able to view lab/test results, encounter notes, upcoming appointments, etc.  Non-urgent messages can be sent to your provider as well.   To learn more about what you can do with MyChart, go to [email or web address].    Your next appointment:    As needed for sleep concerns  Provider:   Dr Tresa Endo    Other Instructions If you have any questions or concerns regarding your c-pap, bi-pap or sleep accessories, please contact Brandie Rorie at [phone].

## 2023-02-27 IMAGING — MR MR CARD MORPHOLOGY WO/W CM
45 of 48 series · 45 of 48 positions shown · IV contrast (Contrast agent)
Comparison: none

CLINICAL DATA: PVCs, biventricular dilation

EXAM:
CARDIAC MRI
TECHNIQUE: The patient was scanned on a 1.5 Tesla GE magnet. A dedicated
cardiac coil was used. Functional imaging was done using Fiesta
sequences. [DATE], and 4 chamber views were done to assess for RWMA's.
Modified Fiep rule using a short axis stack was used to
calculate an ejection fraction on a dedicated work station using
Circle software. The patient received 8 cc of Gadavist. After 10
minutes inversion recovery sequences were used to assess for
infiltration and scar tissue.

[Series 4: [id number] · axial · 8.0mm · 1.64mm/px · 1 of 16 slices shown]
[im 1/16]
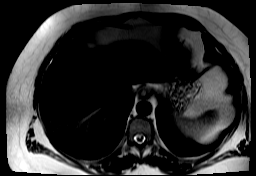

[Series 8: bSSFP · oblique · 8.0mm · 1.61mm/px · 1 of 25 slices shown (1 of 22)]
[im 1/25]
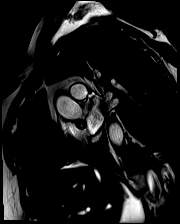

[Series 9: bSSFP · oblique · 8.0mm · 1.61mm/px · 1 of 25 slices shown (2 of 22)]
[im 1/25]
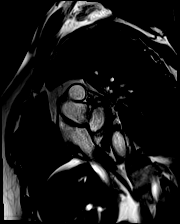

[Series 10: bSSFP · oblique · 8.0mm · 1.61mm/px · 1 of 25 slices shown (3 of 22)]
[im 1/25]
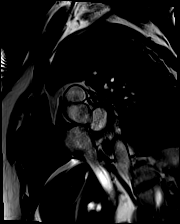

[Series 11: bSSFP · oblique · 8.0mm · 1.61mm/px · 1 of 25 slices shown (4 of 22)]
[im 1/25]
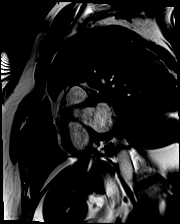

[Series 12: bSSFP · oblique · 8.0mm · 1.61mm/px · 1 of 25 slices shown (5 of 22)]
[im 1/25]
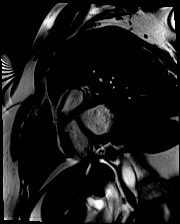

[Series 13: bSSFP · oblique · 8.0mm · 1.61mm/px · 1 of 25 slices shown (6 of 22)]
[im 1/25]
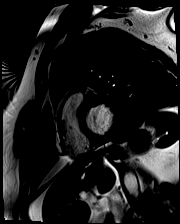

[Series 14: bSSFP · oblique · 8.0mm · 1.61mm/px · 1 of 25 slices shown (7 of 22)]
[im 1/25]
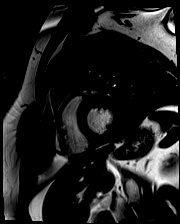

[Series 15: bSSFP · oblique · 8.0mm · 1.61mm/px · 1 of 25 slices shown (8 of 22)]
[im 1/25]
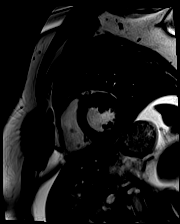

[Series 16: bSSFP · oblique · 8.0mm · 1.61mm/px · 1 of 25 slices shown (9 of 22)]
[im 1/25]
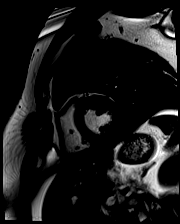

[Series 17: bSSFP · oblique · 8.0mm · 1.61mm/px · 1 of 25 slices shown (10 of 22)]
[im 1/25]
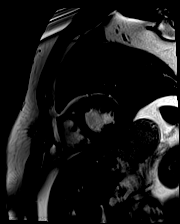

[Series 18: bSSFP · oblique · 8.0mm · 1.61mm/px · 1 of 25 slices shown (11 of 22)]
[im 1/25]
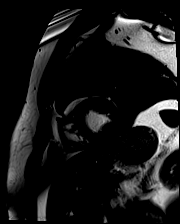

[Series 19: bSSFP · oblique · 8.0mm · 1.61mm/px · 1 of 25 slices shown (12 of 22)]
[im 1/25]
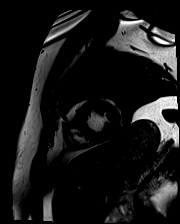

[Series 20: bSSFP · oblique · 8.0mm · 1.61mm/px · 1 of 25 slices shown (13 of 22)]
[im 1/25]
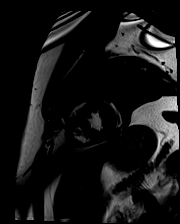

[Series 21: bSSFP · oblique · 8.0mm · 1.61mm/px · 1 of 25 slices shown (14 of 22)]
[im 1/25]
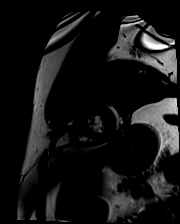

[Series 22: bSSFP · oblique · 8.0mm · 1.61mm/px · 1 of 25 slices shown (15 of 22)]
[im 1/25]
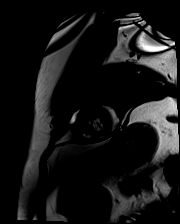

[Series 23: bSSFP · oblique · 8.0mm · 1.61mm/px · 1 of 25 slices shown (16 of 22)]
[im 1/25]
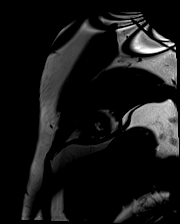

[Series 24: bSSFP · oblique · 8.0mm · 1.61mm/px · 1 of 25 slices shown (17 of 22)]
[im 1/25]
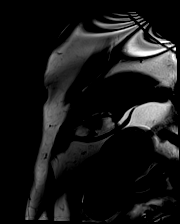

[Series 25: bSSFP · oblique · 8.0mm · 1.61mm/px · 1 of 25 slices shown (18 of 22)]
[im 1/25]
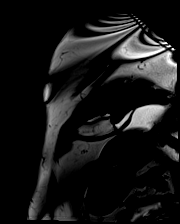

[Series 26: (id)_long_t1 · oblique · 8.0mm · 1.64mm/px · 1 of 24 slices shown]
[im 1/24]
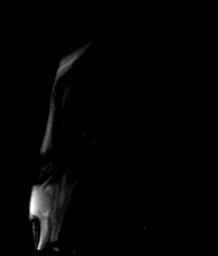

[Series 27: (id)_long_t1_moco · oblique · 8.0mm · 1.64mm/px · 1 of 24 slices shown]
[im 1/24]
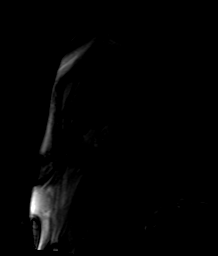

[Series 35: bSSFP · oblique · 6.0mm · 1.56mm/px · 1 of 25 slices shown (19 of 22)]
[im 1/25]
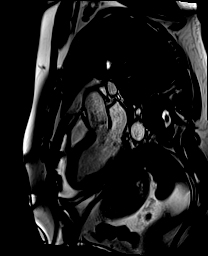

[Series 36: bSSFP · oblique · 6.0mm · 1.56mm/px · 1 of 25 slices shown (20 of 22)]
[im 1/25]
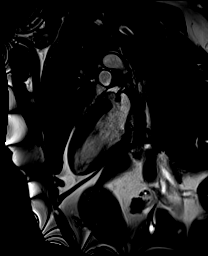

[Series 37: bSSFP · axial · 6.0mm · 1.56mm/px · 1 of 25 slices shown (21 of 22)]
[im 1/25]
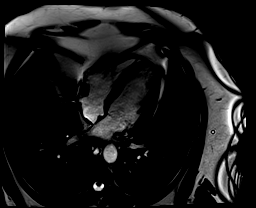

[Series 38: cine_trufi_cs_rt_short axis · oblique · 8.0mm · 2.02mm/px · 1 of 13 slices shown (1 of 20)]
[im 1/13]
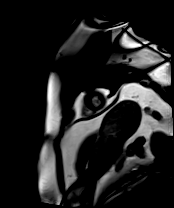

[Series 38: cine_trufi_cs_rt_short axis · oblique · 8.0mm · 2.02mm/px · 1 of 13 slices shown (2 of 20)]
[im 1/13]
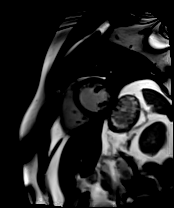

[Series 38: cine_trufi_cs_rt_short axis · oblique · 8.0mm · 2.02mm/px · 1 of 13 slices shown (3 of 20)]
[im 1/13]
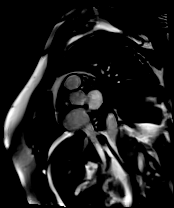

[Series 38: cine_trufi_cs_rt_short axis · oblique · 8.0mm · 2.02mm/px · 1 of 13 slices shown (4 of 20)]
[im 1/13]
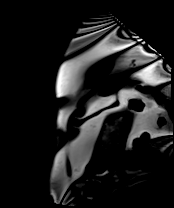

[Series 38: cine_trufi_cs_rt_short axis · oblique · 8.0mm · 2.02mm/px · 1 of 13 slices shown (5 of 20)]
[im 1/13]
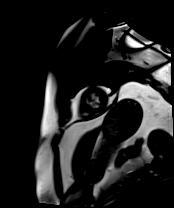

[Series 38: cine_trufi_cs_rt_short axis · oblique · 8.0mm · 2.02mm/px · 1 of 13 slices shown (6 of 20)]
[im 1/13]
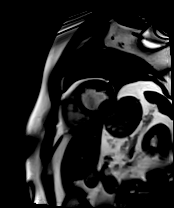

[Series 38: cine_trufi_cs_rt_short axis · oblique · 8.0mm · 2.02mm/px · 1 of 13 slices shown (7 of 20)]
[im 1/13]
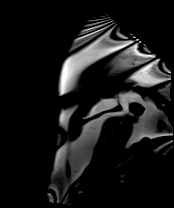

[Series 38: cine_trufi_cs_rt_short axis · oblique · 8.0mm · 2.02mm/px · 1 of 13 slices shown (8 of 20)]
[im 1/13]
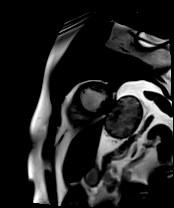

[Series 38: cine_trufi_cs_rt_short axis · oblique · 8.0mm · 2.02mm/px · 1 of 13 slices shown (9 of 20)]
[im 1/13]
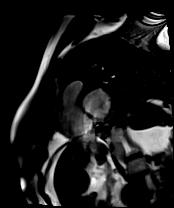

[Series 38: cine_trufi_cs_rt_short axis · oblique · 8.0mm · 2.02mm/px · 1 of 13 slices shown (10 of 20)]
[im 1/13]
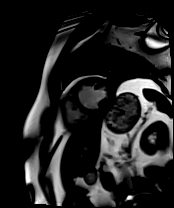

[Series 38: cine_trufi_cs_rt_short axis · oblique · 8.0mm · 2.02mm/px · 1 of 13 slices shown (11 of 20)]
[im 1/13]
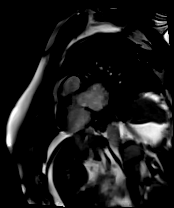

[Series 38: cine_trufi_cs_rt_short axis · oblique · 8.0mm · 2.02mm/px · 1 of 13 slices shown (12 of 20)]
[im 1/13]
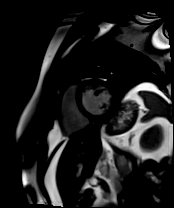

[Series 38: cine_trufi_cs_rt_short axis · oblique · 8.0mm · 2.02mm/px · 1 of 13 slices shown (13 of 20)]
[im 1/13]
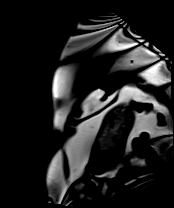

[Series 38: cine_trufi_cs_rt_short axis · oblique · 8.0mm · 2.02mm/px · 1 of 13 slices shown (14 of 20)]
[im 1/13]
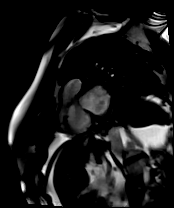

[Series 38: cine_trufi_cs_rt_short axis · oblique · 8.0mm · 2.02mm/px · 1 of 13 slices shown (15 of 20)]
[im 1/13]
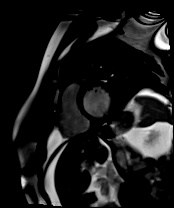

[Series 38: cine_trufi_cs_rt_short axis · oblique · 8.0mm · 2.02mm/px · 1 of 13 slices shown (16 of 20)]
[im 1/13]
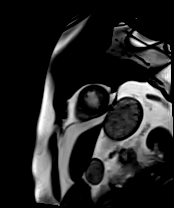

[Series 38: cine_trufi_cs_rt_short axis · oblique · 8.0mm · 2.02mm/px · 1 of 13 slices shown (17 of 20)]
[im 1/13]
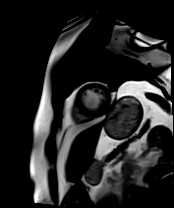

[Series 38: cine_trufi_cs_rt_short axis · oblique · 8.0mm · 2.02mm/px · 1 of 13 slices shown (18 of 20)]
[im 1/13]
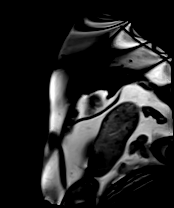

[Series 39: bSSFP · coronal · 6.0mm · 1.41mm/px · 1 of 25 slices shown (22 of 22)]
[im 1/25]
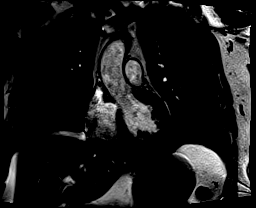

[Series 40: cine_trufi_cs_rt_short axis · oblique · 8.0mm · 2.02mm/px · 1 of 13 slices shown (19 of 20)]
[im 1/13]
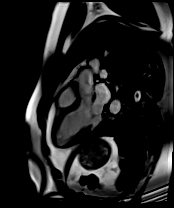

[Series 41: cine_trufi_cs_rt_short axis · axial · 6.0mm · 2.02mm/px · 1 of 12 slices shown (20 of 20)]
[im 1/12]
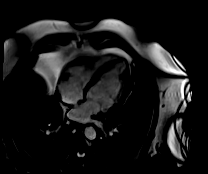

[45 of 48 positions shown; findings below may reference images not displayed]

FINDINGS: Limited images of the lung fields showed no gross abnormalities.

Mildly enlarged left ventricle with normal wall thickness, EF 56%
with normal wall motion. Normal right ventricular size with EF 53%
(normal). Normal left and right atrial sizes. No significant mitral
regurgitation noted. Trileaflet aortic valve with no stenosis or
regurgitation noted.

Normal first pass perfusion images.

On delayed enhancement imaging, there was no myocardial late
gadolinium enhancement (LGE).

MEASUREMENTS:
MEASUREMENTS
LVEDV 257 mL

LVSV 144 mL

LVEF 56%

RVEDV 211 mL

RVSV 113 mL
RVEF 53%

T1 1558, ECV 25%
IMPRESSION: 1. The left ventricle is mildly dilated with normal wall thickness.
EF 56% with normal wall motion.

2.  Normal RV size and systolic function, EF 53%.

3. No myocardial LGE, so no definitive evidence for prior MI,
infiltrative disease, or myocarditis.

4.  Normal T1 indices.

Midori Justo

## 2023-03-02 ENCOUNTER — Encounter: Payer: Self-pay | Admitting: Cardiovascular Disease

## 2023-03-12 ENCOUNTER — Ambulatory Visit: Payer: BC Managed Care – PPO | Admitting: Cardiovascular Disease

## 2023-03-18 DIAGNOSIS — M5416 Radiculopathy, lumbar region: Secondary | ICD-10-CM | POA: Diagnosis not present

## 2023-03-27 DIAGNOSIS — G4733 Obstructive sleep apnea (adult) (pediatric): Secondary | ICD-10-CM | POA: Diagnosis not present

## 2023-07-04 ENCOUNTER — Other Ambulatory Visit (HOSPITAL_COMMUNITY): Payer: Self-pay | Admitting: Family Medicine

## 2023-07-04 ENCOUNTER — Ambulatory Visit (HOSPITAL_COMMUNITY)
Admission: RE | Admit: 2023-07-04 | Discharge: 2023-07-04 | Disposition: A | Discharge status: Home / Self Care | Source: Ambulatory Visit | Attending: Family Medicine | Admitting: Family Medicine

## 2023-07-04 DIAGNOSIS — Z0000 Encounter for general adult medical examination without abnormal findings: Secondary | ICD-10-CM | POA: Diagnosis not present

## 2023-07-04 DIAGNOSIS — Z1331 Encounter for screening for depression: Secondary | ICD-10-CM | POA: Diagnosis not present

## 2023-07-04 DIAGNOSIS — E1159 Type 2 diabetes mellitus with other circulatory complications: Secondary | ICD-10-CM | POA: Diagnosis not present

## 2023-07-04 DIAGNOSIS — K219 Gastro-esophageal reflux disease without esophagitis: Secondary | ICD-10-CM | POA: Diagnosis not present

## 2023-07-04 DIAGNOSIS — Z6838 Body mass index (BMI) 38.0-38.9, adult: Secondary | ICD-10-CM | POA: Diagnosis not present

## 2023-07-04 DIAGNOSIS — R1012 Left upper quadrant pain: Secondary | ICD-10-CM | POA: Insufficient documentation

## 2023-07-04 DIAGNOSIS — E7849 Other hyperlipidemia: Secondary | ICD-10-CM | POA: Diagnosis not present

## 2023-07-04 DIAGNOSIS — R0781 Pleurodynia: Secondary | ICD-10-CM | POA: Diagnosis not present

## 2023-07-04 DIAGNOSIS — I493 Ventricular premature depolarization: Secondary | ICD-10-CM | POA: Diagnosis not present

## 2023-07-04 DIAGNOSIS — M545 Low back pain, unspecified: Secondary | ICD-10-CM | POA: Diagnosis not present

## 2023-07-04 DIAGNOSIS — E6609 Other obesity due to excess calories: Secondary | ICD-10-CM | POA: Diagnosis not present

## 2023-07-04 DIAGNOSIS — I10 Essential (primary) hypertension: Secondary | ICD-10-CM | POA: Diagnosis not present

## 2023-07-04 DIAGNOSIS — E782 Mixed hyperlipidemia: Secondary | ICD-10-CM | POA: Diagnosis not present

## 2023-07-04 DIAGNOSIS — I517 Cardiomegaly: Secondary | ICD-10-CM | POA: Diagnosis not present

## 2023-07-11 DIAGNOSIS — M4316 Spondylolisthesis, lumbar region: Secondary | ICD-10-CM | POA: Diagnosis not present

## 2023-07-11 DIAGNOSIS — Z6836 Body mass index (BMI) 36.0-36.9, adult: Secondary | ICD-10-CM | POA: Diagnosis not present

## 2023-07-11 DIAGNOSIS — M549 Dorsalgia, unspecified: Secondary | ICD-10-CM | POA: Diagnosis not present

## 2023-07-31 DIAGNOSIS — M5416 Radiculopathy, lumbar region: Secondary | ICD-10-CM | POA: Diagnosis not present

## 2023-09-10 ENCOUNTER — Encounter: Payer: Self-pay | Admitting: Cardiovascular Disease

## 2023-10-17 DIAGNOSIS — M4316 Spondylolisthesis, lumbar region: Secondary | ICD-10-CM | POA: Diagnosis not present

## 2023-10-22 DIAGNOSIS — E663 Overweight: Secondary | ICD-10-CM | POA: Diagnosis not present

## 2023-10-22 DIAGNOSIS — Z6829 Body mass index (BMI) 29.0-29.9, adult: Secondary | ICD-10-CM | POA: Diagnosis not present

## 2023-10-22 DIAGNOSIS — E1159 Type 2 diabetes mellitus with other circulatory complications: Secondary | ICD-10-CM | POA: Diagnosis not present

## 2023-11-05 DIAGNOSIS — M51361 Other intervertebral disc degeneration, lumbar region with lower extremity pain only: Secondary | ICD-10-CM | POA: Diagnosis not present

## 2023-11-05 DIAGNOSIS — M25551 Pain in right hip: Secondary | ICD-10-CM | POA: Diagnosis not present

## 2023-11-05 DIAGNOSIS — S336XXA Sprain of sacroiliac joint, initial encounter: Secondary | ICD-10-CM | POA: Diagnosis not present

## 2023-11-11 DIAGNOSIS — M51361 Other intervertebral disc degeneration, lumbar region with lower extremity pain only: Secondary | ICD-10-CM | POA: Diagnosis not present

## 2023-11-11 DIAGNOSIS — M25551 Pain in right hip: Secondary | ICD-10-CM | POA: Diagnosis not present

## 2023-11-11 DIAGNOSIS — S336XXA Sprain of sacroiliac joint, initial encounter: Secondary | ICD-10-CM | POA: Diagnosis not present

## 2023-11-12 DIAGNOSIS — S336XXA Sprain of sacroiliac joint, initial encounter: Secondary | ICD-10-CM | POA: Diagnosis not present

## 2023-11-12 DIAGNOSIS — M51361 Other intervertebral disc degeneration, lumbar region with lower extremity pain only: Secondary | ICD-10-CM | POA: Diagnosis not present

## 2023-11-12 DIAGNOSIS — M25551 Pain in right hip: Secondary | ICD-10-CM | POA: Diagnosis not present

## 2023-11-19 DIAGNOSIS — S336XXA Sprain of sacroiliac joint, initial encounter: Secondary | ICD-10-CM | POA: Diagnosis not present

## 2023-11-19 DIAGNOSIS — M25551 Pain in right hip: Secondary | ICD-10-CM | POA: Diagnosis not present

## 2023-11-25 DIAGNOSIS — M25551 Pain in right hip: Secondary | ICD-10-CM | POA: Diagnosis not present

## 2023-11-25 DIAGNOSIS — S336XXA Sprain of sacroiliac joint, initial encounter: Secondary | ICD-10-CM | POA: Diagnosis not present

## 2023-11-25 DIAGNOSIS — M51361 Other intervertebral disc degeneration, lumbar region with lower extremity pain only: Secondary | ICD-10-CM | POA: Diagnosis not present

## 2023-11-26 DIAGNOSIS — M51361 Other intervertebral disc degeneration, lumbar region with lower extremity pain only: Secondary | ICD-10-CM | POA: Diagnosis not present

## 2023-11-26 DIAGNOSIS — S336XXA Sprain of sacroiliac joint, initial encounter: Secondary | ICD-10-CM | POA: Diagnosis not present

## 2023-11-26 DIAGNOSIS — M25551 Pain in right hip: Secondary | ICD-10-CM | POA: Diagnosis not present

## 2023-12-02 DIAGNOSIS — M51361 Other intervertebral disc degeneration, lumbar region with lower extremity pain only: Secondary | ICD-10-CM | POA: Diagnosis not present

## 2023-12-02 DIAGNOSIS — S336XXA Sprain of sacroiliac joint, initial encounter: Secondary | ICD-10-CM | POA: Diagnosis not present

## 2023-12-02 DIAGNOSIS — M25551 Pain in right hip: Secondary | ICD-10-CM | POA: Diagnosis not present

## 2023-12-03 DIAGNOSIS — M25551 Pain in right hip: Secondary | ICD-10-CM | POA: Diagnosis not present

## 2023-12-03 DIAGNOSIS — S336XXA Sprain of sacroiliac joint, initial encounter: Secondary | ICD-10-CM | POA: Diagnosis not present

## 2023-12-03 DIAGNOSIS — M51361 Other intervertebral disc degeneration, lumbar region with lower extremity pain only: Secondary | ICD-10-CM | POA: Diagnosis not present

## 2023-12-23 DIAGNOSIS — S336XXA Sprain of sacroiliac joint, initial encounter: Secondary | ICD-10-CM | POA: Diagnosis not present

## 2023-12-23 DIAGNOSIS — M25551 Pain in right hip: Secondary | ICD-10-CM | POA: Diagnosis not present

## 2023-12-24 DIAGNOSIS — M25551 Pain in right hip: Secondary | ICD-10-CM | POA: Diagnosis not present

## 2023-12-24 DIAGNOSIS — S336XXA Sprain of sacroiliac joint, initial encounter: Secondary | ICD-10-CM | POA: Diagnosis not present

## 2023-12-25 ENCOUNTER — Telehealth: Payer: Self-pay | Admitting: Cardiovascular Disease

## 2023-12-25 DIAGNOSIS — G4733 Obstructive sleep apnea (adult) (pediatric): Secondary | ICD-10-CM

## 2023-12-25 DIAGNOSIS — I10 Essential (primary) hypertension: Secondary | ICD-10-CM

## 2023-12-25 NOTE — Telephone Encounter (Signed)
 Called pt left a message advising request is being sent to our Sleep team to follow up.

## 2023-12-25 NOTE — Telephone Encounter (Signed)
 Pt is calling for a prescription for a new cpap mask. Please advise.

## 2023-12-26 NOTE — Telephone Encounter (Signed)
 DME= [facility]  Supplies order sent to dme.  You had an appointment 02/20/23 which is within a year with Dr Burnard so I think it is ok to order your supplies.

## 2023-12-26 NOTE — Telephone Encounter (Signed)
 Pt is wondering if he has to come to visit first before he can get a new cushion. Please advise.

## 2023-12-30 DIAGNOSIS — M51361 Other intervertebral disc degeneration, lumbar region with lower extremity pain only: Secondary | ICD-10-CM | POA: Diagnosis not present

## 2023-12-30 DIAGNOSIS — S336XXA Sprain of sacroiliac joint, initial encounter: Secondary | ICD-10-CM | POA: Diagnosis not present

## 2023-12-30 DIAGNOSIS — M25551 Pain in right hip: Secondary | ICD-10-CM | POA: Diagnosis not present

## 2024-01-07 DIAGNOSIS — M51361 Other intervertebral disc degeneration, lumbar region with lower extremity pain only: Secondary | ICD-10-CM | POA: Diagnosis not present

## 2024-01-07 DIAGNOSIS — M25551 Pain in right hip: Secondary | ICD-10-CM | POA: Diagnosis not present

## 2024-01-07 DIAGNOSIS — S336XXA Sprain of sacroiliac joint, initial encounter: Secondary | ICD-10-CM | POA: Diagnosis not present

## 2024-01-13 DIAGNOSIS — M25551 Pain in right hip: Secondary | ICD-10-CM | POA: Diagnosis not present

## 2024-01-13 DIAGNOSIS — S336XXA Sprain of sacroiliac joint, initial encounter: Secondary | ICD-10-CM | POA: Diagnosis not present

## 2024-01-13 DIAGNOSIS — M51361 Other intervertebral disc degeneration, lumbar region with lower extremity pain only: Secondary | ICD-10-CM | POA: Diagnosis not present

## 2024-01-14 DIAGNOSIS — E785 Hyperlipidemia, unspecified: Secondary | ICD-10-CM | POA: Diagnosis not present

## 2024-01-14 DIAGNOSIS — Z125 Encounter for screening for malignant neoplasm of prostate: Secondary | ICD-10-CM | POA: Diagnosis not present

## 2024-01-14 DIAGNOSIS — Z0000 Encounter for general adult medical examination without abnormal findings: Secondary | ICD-10-CM | POA: Diagnosis not present

## 2024-01-20 DIAGNOSIS — M51361 Other intervertebral disc degeneration, lumbar region with lower extremity pain only: Secondary | ICD-10-CM | POA: Diagnosis not present

## 2024-01-20 DIAGNOSIS — S336XXA Sprain of sacroiliac joint, initial encounter: Secondary | ICD-10-CM | POA: Diagnosis not present

## 2024-01-20 DIAGNOSIS — M25551 Pain in right hip: Secondary | ICD-10-CM | POA: Diagnosis not present

## 2024-01-27 DIAGNOSIS — S336XXA Sprain of sacroiliac joint, initial encounter: Secondary | ICD-10-CM | POA: Diagnosis not present

## 2024-01-27 DIAGNOSIS — M51361 Other intervertebral disc degeneration, lumbar region with lower extremity pain only: Secondary | ICD-10-CM | POA: Diagnosis not present

## 2024-01-27 DIAGNOSIS — M25551 Pain in right hip: Secondary | ICD-10-CM | POA: Diagnosis not present

## 2024-02-03 DIAGNOSIS — K611 Rectal abscess: Secondary | ICD-10-CM | POA: Diagnosis not present

## 2024-02-03 DIAGNOSIS — M25551 Pain in right hip: Secondary | ICD-10-CM | POA: Diagnosis not present

## 2024-02-03 DIAGNOSIS — M51361 Other intervertebral disc degeneration, lumbar region with lower extremity pain only: Secondary | ICD-10-CM | POA: Diagnosis not present

## 2024-02-03 DIAGNOSIS — L988 Other specified disorders of the skin and subcutaneous tissue: Secondary | ICD-10-CM | POA: Diagnosis not present

## 2024-02-03 DIAGNOSIS — S336XXA Sprain of sacroiliac joint, initial encounter: Secondary | ICD-10-CM | POA: Diagnosis not present

## 2024-02-11 DIAGNOSIS — S336XXA Sprain of sacroiliac joint, initial encounter: Secondary | ICD-10-CM | POA: Diagnosis not present

## 2024-02-11 DIAGNOSIS — M25551 Pain in right hip: Secondary | ICD-10-CM | POA: Diagnosis not present

## 2024-02-11 DIAGNOSIS — M51361 Other intervertebral disc degeneration, lumbar region with lower extremity pain only: Secondary | ICD-10-CM | POA: Diagnosis not present

## 2024-02-18 DIAGNOSIS — M51361 Other intervertebral disc degeneration, lumbar region with lower extremity pain only: Secondary | ICD-10-CM | POA: Diagnosis not present

## 2024-02-18 DIAGNOSIS — M25551 Pain in right hip: Secondary | ICD-10-CM | POA: Diagnosis not present

## 2024-02-18 DIAGNOSIS — S336XXA Sprain of sacroiliac joint, initial encounter: Secondary | ICD-10-CM | POA: Diagnosis not present

## 2024-02-20 DIAGNOSIS — I420 Dilated cardiomyopathy: Secondary | ICD-10-CM | POA: Diagnosis not present

## 2024-02-20 DIAGNOSIS — K611 Rectal abscess: Secondary | ICD-10-CM | POA: Diagnosis not present

## 2024-02-20 DIAGNOSIS — F39 Unspecified mood [affective] disorder: Secondary | ICD-10-CM | POA: Diagnosis not present

## 2024-02-20 DIAGNOSIS — E785 Hyperlipidemia, unspecified: Secondary | ICD-10-CM | POA: Diagnosis not present

## 2024-02-25 DIAGNOSIS — M25551 Pain in right hip: Secondary | ICD-10-CM | POA: Diagnosis not present

## 2024-02-25 DIAGNOSIS — S336XXA Sprain of sacroiliac joint, initial encounter: Secondary | ICD-10-CM | POA: Diagnosis not present

## 2024-03-10 DIAGNOSIS — M25551 Pain in right hip: Secondary | ICD-10-CM | POA: Diagnosis not present

## 2024-03-10 DIAGNOSIS — S336XXA Sprain of sacroiliac joint, initial encounter: Secondary | ICD-10-CM | POA: Diagnosis not present

## 2024-03-10 DIAGNOSIS — M51361 Other intervertebral disc degeneration, lumbar region with lower extremity pain only: Secondary | ICD-10-CM | POA: Diagnosis not present

## 2024-03-17 ENCOUNTER — Ambulatory Visit: Attending: Cardiovascular Disease | Admitting: Cardiovascular Disease

## 2024-03-17 VITALS — BP 114/56 | HR 74 | Ht 72.0 in | Wt 193.2 lb

## 2024-03-17 DIAGNOSIS — I10 Essential (primary) hypertension: Secondary | ICD-10-CM | POA: Diagnosis not present

## 2024-03-17 DIAGNOSIS — I420 Dilated cardiomyopathy: Secondary | ICD-10-CM

## 2024-03-17 DIAGNOSIS — E663 Overweight: Secondary | ICD-10-CM | POA: Diagnosis not present

## 2024-03-17 DIAGNOSIS — E785 Hyperlipidemia, unspecified: Secondary | ICD-10-CM

## 2024-03-17 DIAGNOSIS — Z8639 Personal history of other endocrine, nutritional and metabolic disease: Secondary | ICD-10-CM | POA: Diagnosis not present

## 2024-03-17 DIAGNOSIS — Z87891 Personal history of nicotine dependence: Secondary | ICD-10-CM

## 2024-03-17 DIAGNOSIS — I493 Ventricular premature depolarization: Secondary | ICD-10-CM

## 2024-03-17 DIAGNOSIS — G4733 Obstructive sleep apnea (adult) (pediatric): Secondary | ICD-10-CM

## 2024-03-17 NOTE — Patient Instructions (Signed)
 Medication Instructions:  Stop Atorvastatin  Stop Lisinopril -hctz *If you need a refill on your cardiac medications before your next appointment, please call your pharmacy*  Send in a BP log in 3-4 weeks  Lab Work: Fasting lipid panel- 3 months If you have labs (blood work) drawn today and your tests are completely normal, you will receive your results only by: MyChart Message (if you have MyChart) OR A paper copy in the mail If you have any lab test that is abnormal or we need to change your treatment, we will call you to review the results.  Testing/Procedures: WatchPAT?  Is a FDA cleared portable home sleep study test that uses a watch and 3 points of contact to monitor 7 different channels, including your heart rate, oxygen saturations, body position, snoring, and chest motion.  The study is easy to use from the comfort of your own home and accurately detect sleep apnea.  Before bed, you attach the chest sensor, attached the sleep apnea bracelet to your nondominant hand, and attach the finger probe.  After the study, the raw data is downloaded from the watch and scored for apnea events.   For more information: [email or web address]  Patient Testing Instructions:  Do not put battery into the device until bedtime when you are ready to begin the test. Please call the support number if you need assistance after following the instructions below: 24 hour support line- [phone] or [facility] support at [phone] (option 2)  Download the [facility] WatchPAT One app through the google play store or App Store  Be sure to turn on or enable access to bluetooth in settlings on your smartphone/ device  Make sure no other bluetooth devices are on and within the vicinity of your smartphone/ device and WatchPAT watch during testing.  Make sure to leave your smart phone/ device plugged in and charging all night.  When ready for bed:  Follow the instructions step by step in the  WatchPAT One App to activate the testing device. For additional instructions, including video instruction, visit the WatchPAT One video on Youtube. You can search for WatchPat One within Youtube (video is 4 minutes and 18 seconds) or enter: https://youtube/[email or web address] Please note: You will be prompted to enter a Pin to connect via bluetooth when starting the test. The PIN will be assigned to you when you receive the test.  The device is disposable, but it recommended that you retain the device until you receive a call letting you know the study has been received and the results have been interpreted.  We will let you know if the study did not transmit to us  properly after the test is completed. You do not need to call us  to confirm the receipt of the test.  Please complete the test within 48 hours of receiving PIN.   Frequently Asked Questions:  What is Watch Bruna one?  A single use fully disposable home sleep apnea testing device and will not need to be returned after completion.  What are the requirements to use WatchPAT one?  The be able to have a successful watchpat one sleep study, you should have your Watch pat one device, your smart phone, watch pat one app, your PIN number and Internet access What type of phone do I need?  You should have a smart phone that uses Android 5.1 and above or any Iphone with IOS 10 and above How can I download the WatchPAT one app?  Based on your device type search for WatchPAT one app  either in google play for android devices or APP store for Iphone's Where will I get my PIN for the study?  Your PIN will be provided by your physician's office. It is used for authentication and if you lose/forget your PIN, please reach out to your providers office.  I do not have Internet at home. Can I do WatchPAT one study?  WatchPAT One needs Internet connection throughout the night to be able to transmit the sleep data. You can use your home/local internet or your  cellular's data package. However, it is always recommended to use home/local Internet. It is estimated that between 20MB-30MB will be used with each study.However, the application will be looking for space in the phone to start the study.  What happens if I lose internet or bluetooth connection?  During the internet disconnection, your phone will not be able to transmit the sleep data. All the data, will be stored in your phone. As soon as the internet connection is back on, the phone will being sending the sleep data. During the bluetooth disconnection, WatchPAT one will not be able to to send the sleep data to your phone. Data will be kept in the WatchPAT one until two devices have bluetooth connection back on. As soon as the connection is back on, WatchPAT one will send the sleep data to the phone.  How long do I need to wear the WatchPAT one?  After you start the study, you should wear the device at least 6 hours.  How far should I keep my phone from the device?  During the night, your phone should be within 15 feet.  What happens if I leave the room for restroom or other reasons?  Leaving the room for any reason will not cause any problem. As soon as your get back to the room, both devices will reconnect and will continue to send the sleep data. Can I use my phone during the sleep study?  Yes, you can use your phone as usual during the study. But it is recommended to put your watchpat one on when you are ready to go to bed.  How will I get my study results?  A soon as you completed your study, your sleep data will be sent to the provider. They will then share the results with you when they are ready.    Follow-Up: At [facility], you and your health needs are our priority.  As part of our continuing mission to provide you with exceptional heart care, our providers are all part of one team.  This team includes your primary Cardiologist (physician) and Advanced Practice Providers or  APPs (Physician Assistants and Nurse Practitioners) who all work together to provide you with the care you need, when you need it.  Your next appointment:   1 year(s)  Provider:   Jerel Balding, MD    We recommend signing up for the patient portal called MyChart.  Sign up information is provided on this After Visit Summary.  MyChart is used to connect with patients for Virtual Visits (Telemedicine).  Patients are able to view lab/test results, encounter notes, upcoming appointments, etc.  Non-urgent messages can be sent to your provider as well.   To learn more about what you can do with MyChart, go to [email or web address].

## 2024-03-17 NOTE — Progress Notes (Unsigned)
 Cardiology Office Note (new patient):    Date:  03/17/2024   ID:  Terrence Robinson, DOB September 07, 1965, MRN [id number]  PCP:  Bertell Satterfield, MD  [facility] Cardiologist:  Jerel Balding, MD  [facility] Electrophysiologist:  None   Referring MD: Bertell Satterfield, MD   No chief complaint on file.     History of Present Illness:    Terrence Robinson is a 58 y.o. male with a hx of essential hypertension, previous smoker, hypercholesterolemia, who was initially seen due to palpitations associated with PVCs.  Since his last appointment he has been largely very compliant with CPAP therapy.  Unfortunately he has recently developed a new herniated disc and needs to have back surgery, scheduled for July 3 with Dr. Chyrl Budge.  He needs new equipment after his hose was destroyed by one of his pets.  He definitely feels improvement in his energy level when he uses CPAP and has been faithful to it until the equipment problem.  Until his new herniated disc problem he was quite physically active and had no problems with shortness of breath or chest pain with activity.  He has not experienced syncope.  He does not have lower extremity edema, orthopnea or PND.  He quit smoking successfully, but subsequently gained 30 pounds in weight (gained a total of 60 pounds in the last 4 years).  He is now in severe obesity range with a BMI over 35.  Has a big weakness for bread and potatoes.  We have had the discussion regarding the glycemic index and the adverse impact of sweets and easily digestible starches on his metabolic profile.  His most recent hemoglobin A1c was fully in diabetes range at 6.7% (he is not yet on any medications for his glucose levels).  Recent labs showed a satisfactory reduction in LDL cholesterol to 76, close to our target range.  A Kardiamobile transmission that he submitted for chest pressure showed extremely frequent monomorphic PVCs.  On several previous ECG tracings in the office  he had frequent monomorphic PVCs that are probably of LVOT origin (RBBB morphology in lead V1, monophasic in the limb leads with inferior axis, transition lead in V2).  On exam today he does not have any PVCs and we did not catch any ectopy on the twelve-lead ECG either.  He is on metoprolol  succinate.  Echocardiogram in January 2023 showed mild dilation of both the right and left ventricles, but with preserved systolic function.  No significant valve problems are seen.  Please continue to keep him n.p.o.  Cardiac MRI performed in February 2023 showed normal left and right ventricular systolic function (LVEF 56%, RVEF 53%) and no evidence of late gadolinium enhancement.  Past Medical History:  Diagnosis Date   Degenerative joint disease (DJD) of lumbar spine    back DJD   Essential hypertension    Generalized anxiety disorder    GERD (gastroesophageal reflux disease)    HLD (hyperlipidemia) 04/12/2016   Hypertension    Neuromuscular disorder (HCC)    sciatica   OSA (obstructive sleep apnea) 07/06/2021   Pneumonia    Tobacco abuse 04/05/2016    Past Surgical History:  Procedure Laterality Date   AMPUTATION  2003   finger left hand   BACK SURGERY     x 3   CARPAL TUNNEL RELEASE Right    SPINE SURGERY     2001, 2010, 2012    Current Medications: Current Meds  Medication Sig   atorvastatin  (LIPITOR) 40 MG  tablet Take 1 tablet (40 mg total) by mouth daily.   esomeprazole (NEXIUM) 40 MG capsule Take 40 mg by mouth daily.   lamoTRIgine (LAMICTAL) 100 MG tablet Take 200 mg by mouth daily.   lisinopril -hydrochlorothiazide  (ZESTORETIC ) 20-12.5 MG tablet Take 2 tablets by mouth daily.   metoprolol  succinate (TOPROL -XL) 50 MG 24 hr tablet TAKE 1 TABLET BY MOUTH DAILY. TAKE WITH OR IMMEDIATELY FOLLOWING A MEAL.     Allergies:   Erythromycin   Social History   Socioeconomic History   Marital status: Married    Spouse name: wendy   Number of children: 2   Years of education: 12    Highest education level: Not on file  Occupational History   Occupation: set designer  Tobacco Use   Smoking status: Former    Current packs/day: 0.00    Average packs/day: 1 pack/day for 33.3 years (33.3 ttl pk-yrs)    Types: Cigarettes    Start date: 04/01/1986    Quit date: 07/30/2019    Years since quitting: 4.6   Smokeless tobacco: Never  Substance and Sexual Activity   Alcohol use: No   Drug use: No   Sexual activity: Yes    Birth control/protection: Post-menopausal  Other Topics Concern   Not on file  Social History Narrative   Lives with wife Sari    one son-  Katrine in college   Maintenance at [employer]    Social Drivers of Health   Tobacco Use: Medium Risk (03/08/2024)   Received from [facility]   Patient History    Smoking Tobacco Use: Former    Smokeless Tobacco Use: Never    Passive Exposure: Not on file  Financial Resource Strain: Low Risk (01/12/2024)   Received from [facility]   Overall Financial Resource Strain (CARDIA)    How hard is it for you to pay for the very basics like food, housing, medical care, and heating?: Not very hard  Food Insecurity: No Food Insecurity (01/12/2024)   Received from [facility]   Epic    Within the past 12 months, you worried that your food would run out before you got the money to buy more.: Never true    Within the past 12 months, the food you bought just didn't last and you didn't have money to get more.: Never true  Transportation Needs: No Transportation Needs (01/12/2024)   Received from [facility]    In the past 12 months, has lack of transportation kept you from medical appointments or from getting medications?: No    In the past 12 months, has lack of transportation kept you from meetings, work, or from getting things needed for daily living?: No  Physical Activity: Sufficiently Active (01/12/2024)   Received from [facility]   Exercise Vital Sign    On average, how many days per week do  you engage in moderate to strenuous exercise (like a brisk walk)?: 3 days    On average, how many minutes do you engage in exercise at this level?: 60 min  Stress: Stress Concern Present (01/12/2024)   Received from [facility] - Occupational Stress Questionnaire    Do you feel stress - tense, restless, nervous, or anxious, or unable to sleep at night because your mind is troubled all the time - these days?: To some extent  Social Connections: Socially Integrated (01/12/2024)   Received from [facility]   Social Network  How would you rate your social network (family, work, friends)?: Good participation with social networks  Depression (PHQ2-9): Not on file  Alcohol Screen: Not on file  Housing: Low Risk (01/12/2024)   Received from [facility]    In the last 12 months, was there a time when you were not able to pay the mortgage or rent on time?: No    In the past 12 months, how many times have you moved where you were living?: 0    At any time in the past 12 months, were you homeless or living in a shelter (including now)?: No  Utilities: Not At Risk (01/12/2024)   Received from [facility]    In the past 12 months has the electric, gas, oil, or water company threatened to shut off services in your home?: No  Health Literacy: Not on file     Family History: The patient's family history includes Arthritis in his father; Cancer in his father and paternal grandfather; Diabetes in his paternal grandmother; Heart disease in his father; Hyperlipidemia in his brother; Hypertension in his father; Kidney disease in his father.  ROS:   Please see the history of present illness.    All other systems reviewed and are negative.  EKGs/Labs/Other Studies Reviewed:     Echocardiogram 04/11/2021   1. Left ventricular ejection fraction, by estimation, is 55 to 60%. The left ventricle has normal function. The left ventricle has no  regional wall motion abnormalities. The left ventricular internal cavity size was mildly dilated. There is mild left ventricular hypertrophy. Left ventricular diastolic parameters were normal.   2. Right ventricular systolic function is normal. The right ventricular size is mildly enlarged. Tricuspid regurgitation signal is inadequate for assessing PA pressure.   3. The mitral valve is normal in structure. No evidence of mitral valve regurgitation. No evidence of mitral stenosis.   4. The aortic valve is tricuspid. Aortic valve regurgitation is not visualized. No aortic stenosis is present.   5. The inferior vena cava is normal in size with greater than 50% respiratory variability, suggesting right atrial pressure of 3 mmHg.   Cardiac MRI 05/09/2021: 1. The left ventricle is mildly dilated with normal wall thickness.EF 56% with normal wall motion.   2.  Normal RV size and systolic function, EF 53%.   3. No myocardial LGE, so no definitive evidence for prior MI, infiltrative disease, or myocarditis.   4.  Normal T1 indices.  EKG:  EKG is  ordered today.  It shows normal sinus rhythm is a normal tracing.  No PVCs are seen.  Recent Labs: No results found for requested labs within last 365 days.  05/10/2019 Hemoglobin 15.1, creatinine 0.79, potassium 4.4, normal liver function tests, TSH 0.795 11/10/2018 Hemoglobin A1c 5.8% 05/25/2020 Creatinine 0.69, potassium 4.4, hemoglobin 15.1 ALT 52, TSH 1.99, Hemoglobin A1c 6.0%  Recent Lipid Panel    Component Value Date/Time   CHOL 124 04/02/2017 1032   TRIG 83 04/02/2017 1032   HDL 46 04/02/2017 1032   CHOLHDL 2.7 04/02/2017 1032   VLDL 18 07/10/2016 0925   LDLCALC 61 04/02/2017 1032  05/10/2019 Total cholesterol 107, HDL 43, LDL 50, triglycerides 61 05/25/2020 Cholesterol 124, HDL 45, LDL 63, triglycerides 80  Physical Exam:    VS:  BP (!) 114/56 (BP Location: Left Arm, Patient Position: Sitting, Cuff Size: Normal)   Pulse 74   Ht  6' (1.829 m)   Wt 193 lb 3.2 oz (87.6  kg)   SpO2 96%   BMI 26.20 kg/m     Wt Readings from Last 3 Encounters:  03/17/24 193 lb 3.2 oz (87.6 kg)  02/20/23 281 lb (127.5 kg)  09/20/22 263 lb 3.2 oz (119.4 kg)      General: Alert, oriented x3, no distress, severely obese Head: no evidence of trauma, PERRL, EOMI, no exophtalmos or lid lag, no myxedema, no xanthelasma; normal ears, nose and oropharynx Neck: normal jugular venous pulsations and no hepatojugular reflux; brisk carotid pulses without delay and no carotid bruits Chest: clear to auscultation, no signs of consolidation by percussion or palpation, normal fremitus, symmetrical and full respiratory excursions Cardiovascular: normal position and quality of the apical impulse, regular rhythm, normal first and second heart sounds, no murmurs, rubs or gallops Abdomen: no tenderness or distention, no masses by palpation, no abnormal pulsatility or arterial bruits, normal bowel sounds, no hepatosplenomegaly Extremities: no clubbing, cyanosis or edema; 2+ radial, ulnar and brachial pulses bilaterally; 2+ right femoral, posterior tibial and dorsalis pedis pulses; 2+ left femoral, posterior tibial and dorsalis pedis pulses; no subclavian or femoral bruits Neurological: grossly nonfocal Psych: Normal mood and affect      ASSESSMENT:    1. Dilated cardiomyopathy (HCC)   2. PVC's (premature ventricular contractions)       PLAN:    In order of problems listed above:  Preoperative cardiovascular exam: I think he is at low risk for major cardiovascular complications with the planned back surgery.  Important not to discontinue his beta-blocker and pertinently in the perioperative period.   Cardiomyopathy: During workup for his PVCs we discover that he has a dilated left ventricle but he has normal left ventricular systolic function.  This time to reevaluate his echocardiogram, but this does not need to be performed necessarily before he has  his back surgery.  He did not have any evidence of scar/late gadolinium enhancement on his Cardiac MRI.  He is on a beta-blocker and ACE inhibitor.  He has no clear findings to support a diagnosis of congestive heart failure.  Note that diastolic function parameters were also normal.  Plan to repeat an echocardiogram in a year.  He has not had a formal ischemic work-up, but does not really have symptoms compatible with angina pectoris.  Imaging studies are not compatible with ischemic cardiomyopathy.  The focus is on risk factor modification. PVCs: None were seen on his ECG tracing today and I did not hear any ectopy during his exam.  Unsure whether these are a consequence or cause of cardiomyopathy.  He has occasional episodes of chest discomfort at rest, never associated with physical activity, appear to correlate with increased frequency of PVCs.  The PVCs appear to have LVOT origin.  Unlikely to tolerate higher doses of beta-blocker.  Consider referral to EP to discuss ablation or treatment with true antiarrhythmic in the future, if they continue to be a problem..   OSA: He has definitely noticed improvement from use of CPAP.  He was compliant with CPAP therapy 100% of the time until damage to his equipment from one of his pets.  We need to get him new equipment. HTN: Typically well-controlled.  He is having quite a bit of back pain today which is why he thinks his blood pressure is a little high.  No changes were made to his medications. HLP: LDL is great, but low HDL will not improve without substantial weight loss. DM: He managed to improve his hemoglobin A1c with diet and  exercise last year, but now this has deteriorated again and he is in full diabetes range. Obesity: Severely obese.  Reviewed the importance of exercise after he fixes the back problem.  Again reviewed the glycemic index and a healthy diet. History of smoking, 40 pack years:.  He does not have any clear symptoms of COPD.   Congratulated him on avoiding smoking, even though associated with weight gain.   Medication Adjustments/Labs and Tests Ordered: Current medicines are reviewed at length with the patient today.  Concerns regarding medicines are outlined above.  Orders Placed This Encounter  Procedures   EKG 12-Lead     No orders of the defined types were placed in this encounter.    There are no Patient Instructions on file for this visit.   Signed, Jerel Balding, MD  03/17/2024 4:07 PM    [facility]

## 2024-03-18 DIAGNOSIS — F39 Unspecified mood [affective] disorder: Secondary | ICD-10-CM | POA: Diagnosis not present

## 2024-03-29 DIAGNOSIS — Z881 Allergy status to other antibiotic agents status: Secondary | ICD-10-CM | POA: Diagnosis not present

## 2024-03-29 DIAGNOSIS — K648 Other hemorrhoids: Secondary | ICD-10-CM | POA: Diagnosis not present

## 2024-03-29 DIAGNOSIS — E785 Hyperlipidemia, unspecified: Secondary | ICD-10-CM | POA: Diagnosis not present

## 2024-03-29 DIAGNOSIS — K605 Anorectal fistula, unspecified: Secondary | ICD-10-CM | POA: Diagnosis not present

## 2024-03-29 DIAGNOSIS — Z79899 Other long term (current) drug therapy: Secondary | ICD-10-CM | POA: Diagnosis not present

## 2024-03-29 DIAGNOSIS — Z87891 Personal history of nicotine dependence: Secondary | ICD-10-CM | POA: Diagnosis not present

## 2024-03-29 DIAGNOSIS — K219 Gastro-esophageal reflux disease without esophagitis: Secondary | ICD-10-CM | POA: Diagnosis not present

## 2024-03-29 DIAGNOSIS — G4733 Obstructive sleep apnea (adult) (pediatric): Secondary | ICD-10-CM | POA: Diagnosis not present

## 2024-03-29 DIAGNOSIS — K644 Residual hemorrhoidal skin tags: Secondary | ICD-10-CM | POA: Diagnosis not present

## 2024-03-29 DIAGNOSIS — I10 Essential (primary) hypertension: Secondary | ICD-10-CM | POA: Diagnosis not present

## 2024-04-03 ENCOUNTER — Encounter (HOSPITAL_BASED_OUTPATIENT_CLINIC_OR_DEPARTMENT_OTHER): Payer: Self-pay | Admitting: Cardiology

## 2024-04-03 DIAGNOSIS — G4733 Obstructive sleep apnea (adult) (pediatric): Secondary | ICD-10-CM | POA: Diagnosis not present

## 2024-04-04 NOTE — Procedures (Signed)
" ° °  SLEEP STUDY REPORT Patient Information Study Date: 04/04/2024 Patient Name: Terrence Robinson Patient ID: [id number] Birth Date: 01-01-1966 Age: 59 Gender: Male BMI: 26.3 (W=194 lb, H=6' 0'') Referring Physician: Jerel Balding, MD  TEST DESCRIPTION: Home sleep apnea testing was completed using the WatchPat, a Type 1 device, utilizing peripheral arterial tonometry (PAT), chest movement, actigraphy, pulse oximetry, pulse rate, body position and snore. AHI was calculated with apnea and hypopnea using valid sleep time as the denominator. RDI includes apneas, hypopneas, and RERAs. The data acquired and the scoring of sleep and all associated events were performed in accordance with the recommended standards and specifications as outlined in the AASM Manual for the Scoring of Sleep and Associated Events 2.2.0 (2015).  FINDINGS:  1. Mild Obstructive Sleep Apnea with AHI 13.7/hr. All events occurred during supine sleep.  2. No Central Sleep Apnea with pAHIc 5.2/hr.  3. Oxygen desaturations as low as 88%.  4. Moderate snoring was present. O2 sats were < 88% for 0.1 min.  5. Total sleep time was 6 hrs and 53 min.  6. 15.1% of total sleep time was spent in REM sleep.  7. Normal sleep onset latency at 15 min.  8. Prolonged REM sleep onset latency at 247 min.  9. Total awakenings were 7. 10. Arrhythmia detection: None  DIAGNOSIS: Mild Obstructive Sleep Apnea (G47.33)  RECOMMENDATIONS: 1. Clinical correlation of these findings is necessary. The decision to treat obstructive sleep apnea (OSA) is usually based on the presence of apnea symptoms or the presence of associated medical conditions such as Hypertension, Congestive Heart Failure, Atrial Fibrillation or Obesity. The most common symptoms of OSA are snoring, gasping for breath while sleeping, daytime sleepiness and fatigue. 2. Initiating apnea therapy is recommended given the presence of symptoms and/or associated conditions. Recommend  proceeding with one of the following:  a. Auto-CPAP therapy with a pressure range of 5-20cm H2O.  b. An oral appliance (OA) that can be obtained from certain dentists with expertise in sleep medicine. These are primarily of use in non-obese patients with mild and moderate disease.  c. An ENT consultation which may be useful to look for specific causes of obstruction and possible treatment options.  d. If patient is intolerant to PAP therapy, consider referral to ENT for evaluation for hypoglossal nerve stimulator. 3. Close follow-up is necessary to ensure success with CPAP or oral appliance therapy for maximum benefit . 4. A follow-up oximetry study on CPAP is recommended to assess the adequacy of therapy and determine the need for supplemental oxygen or the potential need for Bi-level therapy. An arterial blood gas to determine the adequacy of baseline ventilation and oxygenation should also be considered. 5. Healthy sleep recommendations include: adequate nightly sleep (normal 7-9 hrs/night), avoidance of caffeine after noon and alcohol near bedtime, and maintaining a sleep environment that is cool, dark and quiet. 6. Weight loss for overweight patients is recommended. Even modest amounts of weight loss can significantly improve the severity of sleep apnea. 7. Snoring recommendations include: weight loss where appropriate, side sleeping, and avoidance of alcohol before bed. 8. Operation of motor vehicle should be avoided when sleepy.  Signature: Wilbert Bihari, MD; [facility]; Diplomat, American Board of Sleep Medicine Electronically Signed: 04/04/2024 9:33:11 PM "

## 2024-04-05 ENCOUNTER — Encounter: Payer: Self-pay | Admitting: Cardiovascular Disease

## 2024-04-14 ENCOUNTER — Encounter: Payer: Self-pay | Admitting: Cardiovascular Disease

## 2024-04-22 ENCOUNTER — Telehealth: Payer: Self-pay | Admitting: *Deleted

## 2024-04-22 NOTE — Telephone Encounter (Signed)
 Patient notified of result via his mychart message.

## 2024-04-22 NOTE — Telephone Encounter (Signed)
-----   Message from Wilbert Bihari, MD sent at 04/04/2024  9:39 PM EST ----- Mild OSA but only during supine sleep - no PAP recommended - recommend avoiding sleeping in the supine position
